# Patient Record
Sex: Female | Born: 2000 | Race: White | Hispanic: No | Marital: Single | Smoking: Never smoker
Health system: Southern US, Community
[De-identification: ages and names within clinical notes are randomized; demographics above are authoritative.]

## PROBLEM LIST (undated history)

## (undated) DIAGNOSIS — F419 Anxiety disorder, unspecified: Secondary | ICD-10-CM

## (undated) DIAGNOSIS — F329 Major depressive disorder, single episode, unspecified: Secondary | ICD-10-CM

## (undated) DIAGNOSIS — S060XAA Concussion with loss of consciousness status unknown, initial encounter: Secondary | ICD-10-CM

## (undated) DIAGNOSIS — G43909 Migraine, unspecified, not intractable, without status migrainosus: Secondary | ICD-10-CM

## (undated) DIAGNOSIS — S060X9A Concussion with loss of consciousness of unspecified duration, initial encounter: Secondary | ICD-10-CM

## (undated) DIAGNOSIS — F32A Depression, unspecified: Secondary | ICD-10-CM

## (undated) HISTORY — DX: Major depressive disorder, single episode, unspecified: F32.9

## (undated) HISTORY — DX: Concussion with loss of consciousness status unknown, initial encounter: S06.0XAA

## (undated) HISTORY — DX: Depression, unspecified: F32.A

## (undated) HISTORY — PX: WISDOM TOOTH EXTRACTION: SHX21

## (undated) HISTORY — DX: Concussion with loss of consciousness of unspecified duration, initial encounter: S06.0X9A

## (undated) HISTORY — DX: Migraine, unspecified, not intractable, without status migrainosus: G43.909

## (undated) HISTORY — DX: Anxiety disorder, unspecified: F41.9

---
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
## 2020-01-03 NOTE — Telephone Encounter (Signed)
Left Vm for pt to CB.

## 2020-01-04 ENCOUNTER — Other Ambulatory Visit: Payer: Self-pay

## 2020-01-04 DIAGNOSIS — R000 Tachycardia, unspecified: Secondary | ICD-10-CM

## 2020-01-04 DIAGNOSIS — R002 Palpitations: Secondary | ICD-10-CM

## 2020-01-04 NOTE — Telephone Encounter (Signed)
Please see message below regarding cardiology referral.

## 2020-01-04 NOTE — Telephone Encounter (Signed)
Please change cardio referral or addend referral if able to the cardiologist she mentioned.  Thanks.

## 2020-01-04 NOTE — Telephone Encounter (Signed)
MyChart message read.

## 2020-01-04 NOTE — Telephone Encounter (Signed)
No way to addend current referral. New referral placed with Dr.Miller. Patient will be notified via mychart message.

## 2020-01-06 LAB — COMPREHENSIVE METABOLIC PANEL: Calcium: 5 — AB (ref 8.7–10.7)

## 2020-01-07 ENCOUNTER — Ambulatory Visit: Payer: No Typology Code available for payment source

## 2020-01-10 ENCOUNTER — Ambulatory Visit (INDEPENDENT_AMBULATORY_CARE_PROVIDER_SITE_OTHER): Payer: No Typology Code available for payment source | Admitting: Clinical

## 2020-01-10 DIAGNOSIS — F419 Anxiety disorder, unspecified: Secondary | ICD-10-CM

## 2020-01-19 ENCOUNTER — Encounter: Payer: Self-pay | Admitting: General Practice

## 2020-01-24 ENCOUNTER — Ambulatory Visit (INDEPENDENT_AMBULATORY_CARE_PROVIDER_SITE_OTHER): Payer: No Typology Code available for payment source | Admitting: Clinical

## 2020-01-24 DIAGNOSIS — F419 Anxiety disorder, unspecified: Secondary | ICD-10-CM

## 2020-02-04 ENCOUNTER — Telehealth (INDEPENDENT_AMBULATORY_CARE_PROVIDER_SITE_OTHER): Payer: No Typology Code available for payment source | Admitting: Psychiatry

## 2020-02-04 ENCOUNTER — Other Ambulatory Visit: Payer: Self-pay

## 2020-02-04 DIAGNOSIS — F410 Panic disorder [episodic paroxysmal anxiety] without agoraphobia: Secondary | ICD-10-CM

## 2020-02-04 DIAGNOSIS — F411 Generalized anxiety disorder: Secondary | ICD-10-CM

## 2020-02-04 MED ORDER — HYDROXYZINE HCL 25 MG PO TABS: 25.0000 mg | ORAL_TABLET | Freq: Two times a day (BID) | ORAL | 1 refills | Status: DC | PRN

## 2020-02-04 MED ORDER — VORTIOXETINE HBR 20 MG PO TABS: 20.0000 mg | ORAL_TABLET | Freq: Every day | ORAL | 5 refills | Status: DC

## 2020-02-04 NOTE — Progress Notes (Signed)
BH MD/PA/NP OP Progress Note  02/04/2020 10:15 AM Marisa Gregory  MRN:  [id number] Interview was conducted by phone and I verified that I was speaking with the correct person using two identifiers. I discussed the limitations of evaluation and management by telemedicine and  the availability of in person appointments. Patient expressed understanding and agreed to proceed. Patient location - home; physician - home office.  Chief Complaint: Palpitations.  HPI: 19 yo SWF referred to [place] by her pediatrican for treatment of generalized anxiety disorder. Patient has been struggling with anxiety (and occasional depression) for a few years. She denies experiencing any major trauma in the past, no hx of abuse. 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 She reports feeling "tense all the time", is short of breath and has rapid heartbeat. She does not describe having distinct panic attacks though. No social or performance anxiety reported. She has been prescribed sertraline in 2018 and it worked well for a while. It did however cause some sleep disturbance and she eventually stopped it and was instead started on escitalopram. She tolerated it well and again it seemed to help with anxiety. Dose was gradually increased to 15mg as its effectiveness started to wane. Eventually her pediatrician recommended tapering it off and to seek psychiatric assessment/apointment. .We have started desvenlafaxine 50 mg daily, then increased it to 100 mg. She tolerates it well: no sedation, no increased sleep problems, no GI upset. She did not however experience decrease in anxiety levelafter being on it for two months. [facility] tried to start Trintellix but copayment on it would be $100 with insurance. We therefore added fluvoxamine abut after being on 100 mg for a month her anxiety did not subside and she actually reports feeling more depressed since starting it. Since then her insurtance approved use of Trintellix and after  being on 10 mg dose for few weeks she reports that her anxiety noticeably subsided.Raylynne is in individual counseling.We also planned to do Genesight test but insurance would not cover it. She started college at [employer]. She reports frequent episodes of chest tightness/palpitations. She went to ED and was told that workup is negative and that these are likely panic attacks. She was prescribed hydroxyzine 25 mg which was helpful.    Visit Diagnosis:    ICD-10-CM   1. GAD (generalized anxiety disorder)  F41.1   2. Panic disorder  F41.0     Past Psychiatric History: Please see intake H&P.  Past Medical History:  Past Medical History:  Diagnosis Date  . Anxiety   . Concussion   . Concussion without loss of consciousness 06/11/2017  . Depression   . Migraines     Past Surgical History:  Procedure Laterality Date  . WISDOM TOOTH EXTRACTION      Family Psychiatric History: Reviewed.  Family History:  Family History  Problem Relation Age of Onset  . Migraines Brother   . Alzheimer's disease Maternal Grandmother   . Hyperlipidemia Maternal Grandmother   . Arthritis Maternal Grandmother   . Parkinson's disease Maternal Grandfather   . Arthritis Maternal Grandfather   . Endometrial cancer Paternal Grandmother   . Thyroid disease Paternal Grandmother   . Arthritis Paternal Grandmother   . Depression Paternal Grandmother   . Anxiety disorder Cousin   . Alcohol abuse Father   . Arthritis Paternal Grandfather   . Early death Paternal Grandfather     Social History:  Social History   Socioeconomic History  . Marital status: Single  Spouse name: Not on file  . Number of children: Not on file  . Years of education: Not on file  . Highest education level: Not on file  Occupational History  . Occupation: HS student  Tobacco Use  . Smoking status: Never Smoker  . Smokeless tobacco: Never Used  Vaping Use  . Vaping Use: Never used  Substance and Sexual Activity  . Alcohol  use: No  . Drug use: Never  . Sexual activity: Never    Partners: Male    Birth control/protection: Abstinence    Comment: never sexually active  Other Topics Concern  . Not on file  Social History Narrative   Marital status/children/pets: single.    Education/employment: Electronics engineer.    Safety:      -smoke alarm in the home:Yes     - wears seatbelt: Yes     - Feels safe in their relationships: Yes   Social Determinants of Health   Financial Resource Strain:   . Difficulty of Paying Living Expenses: Not on file  Food Insecurity:   . Worried About Charity fundraiser in the Last Year: Not on file  . Ran Out of Food in the Last Year: Not on file  Transportation Needs:   . Lack of Transportation (Medical): Not on file  . Lack of Transportation (Non-Medical): Not on file  Physical Activity:   . Days of Exercise per Week: Not on file  . Minutes of Exercise per Session: Not on file  Stress:   . Feeling of Stress : Not on file  Social Connections:   . Frequency of Communication with Friends and Family: Not on file  . Frequency of Social Gatherings with Friends and Family: Not on file  . Attends Religious Services: Not on file  . Active Member of Clubs or Organizations: Not on file  . Attends Archivist Meetings: Not on file  . Marital Status: Not on file    Allergies: No Known Allergies  Metabolic Disorder Labs: No results found for: HGBA1C, MPG Lab Results  Component Value Date   PROLACTIN 20.3 06/24/2018   No results found for: CHOL, TRIG, HDL, CHOLHDL, VLDL, LDLCALC Lab Results  Component Value Date   TSH 0.88 12/06/2019   TSH 0.502 06/24/2018    Therapeutic Level Labs: No results found for: LITHIUM No results found for: VALPROATE No components found for:  CBMZ  Current Medications: Current Outpatient Medications  Medication Sig Dispense Refill  . hydrOXYzine (ATARAX/VISTARIL) 25 MG tablet Take 1 tablet (25 mg total) by mouth 2 (two) times  daily as needed for anxiety. 60 tablet 1  . Norethindrone Acetate-Ethinyl Estrad-FE (BLISOVI 24 FE) 1-20 MG-MCG(24) tablet Take 1 tablet by mouth daily. 28 tablet 11  . PREVIDENT 5000 PLUS 1.1 % CREA dental cream SMARTSIG:Application By Mouth    . vortioxetine HBr (TRINTELLIX) 20 MG TABS tablet Take 1 tablet (20 mg total) by mouth daily. 30 tablet 5   No current facility-administered medications for this visit.     Psychiatric Specialty Exam: Review of Systems  Cardiovascular: Positive for palpitations.  Psychiatric/Behavioral: The patient is nervous/anxious.   All other systems reviewed and are negative.   There were no vitals taken for this visit.There is no height or weight on file to calculate BMI.  General Appearance: NA  Eye Contact:  NA  Speech:  Clear and Coherent and Normal Rate  Volume:  Normal  Mood:  Anxious  Affect:  NA  Thought Process:  Goal  Directed and Linear  Orientation:  Full (Time, Place, and Person)  Thought Content: Logical   Suicidal Thoughts:  No  Homicidal Thoughts:  No  Memory:  Immediate;   Good Recent;   Good Remote;   Good  Judgement:  Good  Insight:  Fair  Psychomotor Activity:  NA  Concentration:  Concentration: Good  Recall:  Good  Fund of Knowledge: Good  Language: Good  Akathisia:  Negative  Handed:  Right  AIMS (if indicated): not done  Assets:  Communication Skills Desire for Improvement Financial Resources/Insurance Housing Social Support Vocational/Educational  ADL's:  Intact  Cognition: WNL  Sleep:  Good    Assessment and Plan: 19 yo SWF referred to [place] by her pediatrican for treatment of generalized anxiety disorder. Patient has been struggling with anxiety (and occasional depression) for a few years. She denies experiencing any major trauma in the past, no hx of abuse. 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 She reports feeling "tense all the time", is short of breath and has rapid heartbeat. She does not describe  having distinct panic attacks though. No social or performance anxiety reported. She has been prescribed sertraline in 2018 and it worked well for a while. It did however cause some sleep disturbance and she eventually stopped it and was instead started on escitalopram. She tolerated it well and again it seemed to help with anxiety. Dose was gradually increased to 15mg as its effectiveness started to wane. Eventually her pediatrician recommended tapering it off and to seek psychiatric assessment/apointment. .We have started desvenlafaxine 50 mg daily, then increased it to 100 mg. She tolerates it well: no sedation, no increased sleep problems, no GI upset. She did not however experience decrease in anxiety levelafter being on it for two months. Wethen tried to start Trintellix but copayment on it would be $100 with insurance. We therefore added fluvoxamine abut after being on 100 mg for a month her anxiety did not subside and she actually reports feeling more depressed since starting it. Since then her insurtance approved use of Trintellix and after being on 10 mg dose for few weeks she reports that her anxiety noticeably subsided.Jennessa is in individual counseling.We also planned to do Genesight test but insurance would not cover it. She started college at [employer]. She reports frequent episodes of chest tightness/palpitations. She went to ED and was told that workup is negative and that these are likely panic attacks. She was prescribed hydroxyzine 25 mg which was helpful.   Dx: GAD; Panic disorder  Plan: We willcontinue Trintellix but increase dose to 20 mg daily (takes it in PM) and continue hydroxyzine 25 mg bid prn anxiety.  Next appointment in2 months.The plan was discussed with patient who had an opportunity to ask questions and these were all answered. I spend6minutes inphone consultationwith the patient.   Stephanie Acre, MD 02/04/2020, 10:14 AM

## 2020-02-10 ENCOUNTER — Ambulatory Visit: Payer: No Typology Code available for payment source | Admitting: Clinical

## 2020-02-15 ENCOUNTER — Ambulatory Visit (INDEPENDENT_AMBULATORY_CARE_PROVIDER_SITE_OTHER): Payer: No Typology Code available for payment source | Admitting: Clinical

## 2020-02-15 DIAGNOSIS — F419 Anxiety disorder, unspecified: Secondary | ICD-10-CM | POA: Diagnosis not present

## 2020-02-29 ENCOUNTER — Ambulatory Visit: Payer: No Typology Code available for payment source | Admitting: Clinical

## 2020-03-07 ENCOUNTER — Telehealth (HOSPITAL_COMMUNITY): Payer: No Typology Code available for payment source | Admitting: Psychiatry

## 2020-04-03 ENCOUNTER — Other Ambulatory Visit: Payer: Self-pay

## 2020-04-03 ENCOUNTER — Telehealth (INDEPENDENT_AMBULATORY_CARE_PROVIDER_SITE_OTHER): Payer: No Typology Code available for payment source | Admitting: Psychiatry

## 2020-04-03 DIAGNOSIS — F411 Generalized anxiety disorder: Secondary | ICD-10-CM | POA: Diagnosis not present

## 2020-04-03 DIAGNOSIS — F410 Panic disorder [episodic paroxysmal anxiety] without agoraphobia: Secondary | ICD-10-CM

## 2020-04-03 NOTE — Progress Notes (Addendum)
BH MD/PA/NP OP Progress Note  04/03/2020 10:06 AM Marisa Gregory  MRN:  [id number] Interview was conducted by phone and I verified that I was speaking with the correct person using two identifiers. I discussed the limitations of evaluation and management by telemedicine and  the availability of in person appointments. Patient expressed understanding and agreed to proceed.  Participants in the visit: patient (location - home); physician (location - home office).  Chief Complaint: Episodic anxiety.  HPI: 19 yo SWF who has been struggling with anxiety (and occasional depression) for a few years. She denies experiencing any major trauma in the past, no hx of abuse. 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 She reports feeling "tense all the time", is short of breath and has rapid heartbeat. She does not describe having distinct panic attacks though. No social or performance anxiety reported. She has been prescribed sertraline in 2018 and it worked well for a while. It did however cause some sleep disturbance and she eventually stopped it and was instead started on escitalopram. She tolerated it well and again it seemed to help with anxiety. Dose was gradually increased to 15mg as its effectiveness started to wane. Eventually her pediatrician recommended tapering it off and to seek psychiatric assessment/apointment. .We have started desvenlafaxine 50 mg daily, then increased it to 100 mg. She tolerates it well: no sedation, no increased sleep problems, no GI upset. She did not however experience decrease in anxiety levelafter being on it for two months. Wethen tried to start Trintellix but copayment on it would be $100 with insurance. We therefore added fluvoxamine abut after being on 100 mg for a month her anxiety did not subside and she actually reports feeling more depressed since starting it. Since then her insurtance approved use of Trintellixand after being on 10 mg dose for few weeks she  reports that her anxiety noticeably subsided.Marisa Gregory is in individual counseling.We also planned to do Genesight test but insurance would not cover it.She started college at [employer]. She has been initially having frequent episodes of chest tightness/palpitations. She went to ED and was told that workup is negative and that these are likely panic attacks. She was prescribed hydroxyzine 25 mg which was helpful. We also increased dose of Trintellix to 20 mg in PM which she tolerates well and anxiety attacks are now very infrequent.   Visit Diagnosis:    ICD-10-CM   1. GAD (generalized anxiety disorder)  F41.1   2. Panic disorder  F41.0     Past Psychiatric History: Please see intake H&P.  Past Medical History:  Past Medical History:  Diagnosis Date  . Anxiety   . Concussion   . Concussion without loss of consciousness 06/11/2017  . Depression   . Fracture of lumbar spine ([place]) 08/2015   stress fracture  . Migraines     Past Surgical History:  Procedure Laterality Date  . WISDOM TOOTH EXTRACTION      Family Psychiatric History: Reviewed.  Family History:  Family History  Problem Relation Age of Onset  . Migraines Brother   . Alzheimer's disease Maternal Grandmother   . Hyperlipidemia Maternal Grandmother   . Arthritis Maternal Grandmother   . Parkinson's disease Maternal Grandmother   . Parkinson's disease Maternal Grandfather   . Arthritis Maternal Grandfather   . Endometrial cancer Paternal Grandmother   . Thyroid disease Paternal Grandmother   . Arthritis Paternal Grandmother   . Depression Paternal Grandmother   . Anxiety disorder Cousin   . Alcohol  abuse Father   . Allergies Father   . Arthritis Paternal Grandfather   . Early death Paternal Grandfather     Social History:  Social History   Socioeconomic History  . Marital status: Single    Spouse name: Not on file  . Number of children: Not on file  . Years of education: Not on file  . Highest education  level: Not on file  Occupational History  . Occupation: HS student  Tobacco Use  . Smoking status: Never Smoker  . Smokeless tobacco: Never Used  . Tobacco comment: no smoke exposure  Vaping Use  . Vaping Use: Never used  Substance and Sexual Activity  . Alcohol use: No  . Drug use: Never  . Sexual activity: Never    Partners: Male    Birth control/protection: Abstinence    Comment: never sexually active  Other Topics Concern  . Not on file  Social History Narrative   Marital status/children/pets: single.    Education/employment: Electronics engineer.    Safety:      -smoke alarm in the home:Yes     - wears seatbelt: Yes     - Feels safe in their relationships: Yes   Social Determinants of Health   Financial Resource Strain:   . Difficulty of Paying Living Expenses: Not on file  Food Insecurity:   . Worried About Charity fundraiser in the Last Year: Not on file  . Ran Out of Food in the Last Year: Not on file  Transportation Needs:   . Lack of Transportation (Medical): Not on file  . Lack of Transportation (Non-Medical): Not on file  Physical Activity:   . Days of Exercise per Week: Not on file  . Minutes of Exercise per Session: Not on file  Stress:   . Feeling of Stress : Not on file  Social Connections:   . Frequency of Communication with Friends and Family: Not on file  . Frequency of Social Gatherings with Friends and Family: Not on file  . Attends Religious Services: Not on file  . Active Member of Clubs or Organizations: Not on file  . Attends Archivist Meetings: Not on file  . Marital Status: Not on file    Allergies: No Known Allergies  Metabolic Disorder Labs: No results found for: HGBA1C, MPG Lab Results  Component Value Date   PROLACTIN 20.3 06/24/2018   No results found for: CHOL, TRIG, HDL, CHOLHDL, VLDL, LDLCALC Lab Results  Component Value Date   TSH 0.88 12/06/2019   TSH 0.502 06/24/2018    Therapeutic Level Labs: No results  found for: LITHIUM No results found for: VALPROATE No components found for:  CBMZ  Current Medications: Current Outpatient Medications  Medication Sig Dispense Refill  . hydrOXYzine (ATARAX/VISTARIL) 25 MG tablet Take 1 tablet (25 mg total) by mouth 2 (two) times daily as needed for anxiety. 60 tablet 1  . Norethindrone Acetate-Ethinyl Estrad-FE (BLISOVI 24 FE) 1-20 MG-MCG(24) tablet Take 1 tablet by mouth daily. 28 tablet 11  . PREVIDENT 5000 PLUS 1.1 % CREA dental cream SMARTSIG:Application By Mouth    . vortioxetine HBr (TRINTELLIX) 20 MG TABS tablet Take 1 tablet (20 mg total) by mouth daily. 30 tablet 5   No current facility-administered medications for this visit.     Psychiatric Specialty Exam: Review of Systems  Psychiatric/Behavioral: The patient is nervous/anxious.   All other systems reviewed and are negative.   There were no vitals taken for this visit.There is no  height or weight on file to calculate BMI.  General Appearance: NA  Eye Contact:  NA  Speech:  Clear and Coherent and Normal Rate  Volume:  Normal  Mood:  Less anxious.  Affect:  NA  Thought Process:  Goal Directed  Orientation:  Full (Time, Place, and Person)  Thought Content: Logical   Suicidal Thoughts:  No  Homicidal Thoughts:  No  Memory:  Immediate;   Good Recent;   Good Remote;   Good  Judgement:  Good  Insight:  Good  Psychomotor Activity:  NA  Concentration:  Concentration: Good  Recall:  Good  Fund of Knowledge: Good  Language: Good  Akathisia:  Negative  Handed:  Right  AIMS (if indicated): not done  Assets:  Communication Skills Desire for Improvement Financial Resources/Insurance Housing Physical Health Social Support Vocational/Educational  ADL's:  Intact  Cognition: WNL  Sleep:  Good    Assessment and Plan: 19 yo SWF who has been struggling with anxiety (and occasional depression) for a few years. She denies experiencing any major trauma in the past, no hx of abuse. 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 She reports feeling "tense all the time", is short of breath and has rapid heartbeat. She does not describe having distinct panic attacks though. No social or performance anxiety reported. She has been prescribed sertraline in 2018 and it worked well for a while. It did however cause some sleep disturbance and she eventually stopped it and was instead started on escitalopram. She tolerated it well and again it seemed to help with anxiety. Dose was gradually increased to 15mg as its effectiveness started to wane. Eventually her pediatrician recommended tapering it off and to seek psychiatric assessment/apointment. .We have started desvenlafaxine 50 mg daily, then increased it to 100 mg. She tolerates it well: no sedation, no increased sleep problems, no GI upset. She did not however experience decrease in anxiety levelafter being on it for two months. Wethen tried to start Trintellix but copayment on it would be $100 with insurance. We therefore added fluvoxamine abut after being on 100 mg for a month her anxiety did not subside and she actually reports feeling more depressed since starting it. Since then her insurtance approved use of Trintellixand after being on 10 mg dose for few weeks she reports that her anxiety noticeably subsided.Marisa Gregory is in individual counseling.We also planned to do Genesight test but insurance would not cover it.She started college at [employer]. She has been initially having frequent episodes of chest tightness/palpitations. She went to ED and was told that workup is negative and that these are likely panic attacks. She was prescribed hydroxyzine 25 mg which was helpful. We also increased dose of Trintellix to 20 mg in PM which she tolerates well and anxiety attacks are now very infrequent.  Dx: GAD; Panic disorder  Plan: We willcontinueTrintellix 20 mg daily(takes it in PM) and hydroxyzine 25 mg bid prn anxiety (does not use it  often). Next appointment in3 months.The plan was discussed with patient who had an opportunity to ask questions and these were all answered. I spend40minutes inphone consultationwith the patient.   Stephanie Acre, MD 04/03/2020, 10:06 AM

## 2020-04-07 ENCOUNTER — Other Ambulatory Visit (HOSPITAL_COMMUNITY): Payer: Self-pay | Admitting: Psychiatry

## 2020-04-14 ENCOUNTER — Ambulatory Visit (INDEPENDENT_AMBULATORY_CARE_PROVIDER_SITE_OTHER): Payer: No Typology Code available for payment source | Admitting: Clinical

## 2020-04-14 DIAGNOSIS — F419 Anxiety disorder, unspecified: Secondary | ICD-10-CM

## 2020-05-25 ENCOUNTER — Ambulatory Visit (INDEPENDENT_AMBULATORY_CARE_PROVIDER_SITE_OTHER): Payer: No Typology Code available for payment source | Admitting: Clinical

## 2020-05-25 DIAGNOSIS — F419 Anxiety disorder, unspecified: Secondary | ICD-10-CM | POA: Diagnosis not present

## 2020-05-30 ENCOUNTER — Ambulatory Visit: Payer: No Typology Code available for payment source | Admitting: Clinical

## 2020-07-04 ENCOUNTER — Telehealth (INDEPENDENT_AMBULATORY_CARE_PROVIDER_SITE_OTHER): Payer: No Typology Code available for payment source | Admitting: Psychiatry

## 2020-07-04 ENCOUNTER — Other Ambulatory Visit: Payer: Self-pay

## 2020-07-04 DIAGNOSIS — F411 Generalized anxiety disorder: Secondary | ICD-10-CM

## 2020-07-04 DIAGNOSIS — F410 Panic disorder [episodic paroxysmal anxiety] without agoraphobia: Secondary | ICD-10-CM | POA: Diagnosis not present

## 2020-07-04 MED ORDER — VORTIOXETINE HBR 20 MG PO TABS: 20.0000 mg | ORAL_TABLET | Freq: Every day | ORAL | 5 refills | Status: DC

## 2020-07-04 NOTE — Progress Notes (Signed)
BH MD/PA/NP OP Progress Note  07/04/2020 1:37 PM Marisa Gregory  MRN:  [id number] Interview was conducted by phone and I verified that I was speaking with the correct person using two identifiers. I discussed the limitations of evaluation and management by telemedicine and  the availability of in person appointments. Patient expressed understanding and agreed to proceed. Participants in the visit: patient (location - home); physician (location - home office).  Chief Complaint: None.  HPI: 20 yo SWF who has been struggling with anxiety (and occasional depression) for a few years. She denies experiencing any major trauma in the past, no hx of abuse. 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 She reports occasional feeling of being tense, short of breath and having rapid heartbeat. No social or performance anxiety reported. She has been prescribed sertraline in 2018 and it worked well for a while. It did however cause some sleep disturbance and she eventually stopped it and was instead started on escitalopram. She tolerated it well and again it seemed to help with anxiety. Dose was gradually increased to 15mg as its effectiveness started to wane. We then tried desvenlafaxine and later fluvoxamine without much benefit in terms of decrease in anxiety. She then started on vortioxetine which is well tolerated and works well.She started college at [employer]. She was also prescribed hydroxyzine 25 mg which was helpful and she does not need it often as anxiety attacks are now very infrequent. She is also in counseling.   Visit Diagnosis:    ICD-10-CM   1. Panic disorder  F41.0   2. GAD (generalized anxiety disorder)  F41.1     Past Psychiatric History: Please see intake H&P.  Past Medical History:  Past Medical History:  Diagnosis Date  . Anxiety   . Concussion   . Concussion without loss of consciousness 06/11/2017  . Depression   . Fracture of lumbar spine ([place]) 08/2015   stress fracture  .  Migraines     Past Surgical History:  Procedure Laterality Date  . WISDOM TOOTH EXTRACTION      Family Psychiatric History: Reviewed.  Family History:  Family History  Problem Relation Age of Onset  . Migraines Brother   . Alzheimer's disease Maternal Grandmother   . Hyperlipidemia Maternal Grandmother   . Arthritis Maternal Grandmother   . Parkinson's disease Maternal Grandmother   . Parkinson's disease Maternal Grandfather   . Arthritis Maternal Grandfather   . Endometrial cancer Paternal Grandmother   . Thyroid disease Paternal Grandmother   . Arthritis Paternal Grandmother   . Depression Paternal Grandmother   . Anxiety disorder Cousin   . Alcohol abuse Father   . Allergies Father   . Arthritis Paternal Grandfather   . Early death Paternal Grandfather     Social History:  Social History   Socioeconomic History  . Marital status: Single    Spouse name: Not on file  . Number of children: Not on file  . Years of education: Not on file  . Highest education level: Not on file  Occupational History  . Occupation: HS student  Tobacco Use  . Smoking status: Never Smoker  . Smokeless tobacco: Never Used  . Tobacco comment: no smoke exposure  Vaping Use  . Vaping Use: Never used  Substance and Sexual Activity  . Alcohol use: No  . Drug use: Never  . Sexual activity: Never    Partners: Male    Birth control/protection: Abstinence    Comment: never sexually active  Other  Topics Concern  . Not on file  Social History Narrative   Marital status/children/pets: single.    Education/employment: Electronics engineer.    Safety:      -smoke alarm in the home:Yes     - wears seatbelt: Yes     - Feels safe in their relationships: Yes   Social Determinants of Health   Financial Resource Strain: Not on file  Food Insecurity: Not on file  Transportation Needs: Not on file  Physical Activity: Not on file  Stress: Not on file  Social Connections: Not on file     Allergies: No Known Allergies  Metabolic Disorder Labs: No results found for: HGBA1C, MPG Lab Results  Component Value Date   PROLACTIN 20.3 06/24/2018   No results found for: CHOL, TRIG, HDL, CHOLHDL, VLDL, LDLCALC Lab Results  Component Value Date   TSH 0.88 12/06/2019   TSH 0.502 06/24/2018    Therapeutic Level Labs: No results found for: LITHIUM No results found for: VALPROATE No components found for:  CBMZ  Current Medications: Current Outpatient Medications  Medication Sig Dispense Refill  . hydrOXYzine (ATARAX/VISTARIL) 25 MG tablet TAKE 1 TABLET BY MOUTH TWICE A DAY AS NEEDED FOR ANXIETY 60 tablet 1  . Norethindrone Acetate-Ethinyl Estrad-FE (BLISOVI 24 FE) 1-20 MG-MCG(24) tablet Take 1 tablet by mouth daily. 28 tablet 11  . PREVIDENT 5000 PLUS 1.1 % CREA dental cream SMARTSIG:Application By Mouth    . vortioxetine HBr (TRINTELLIX) 20 MG TABS tablet Take 1 tablet (20 mg total) by mouth daily. 30 tablet 5   No current facility-administered medications for this visit.     Psychiatric Specialty Exam: Review of Systems  All other systems reviewed and are negative.   There were no vitals taken for this visit.There is no height or weight on file to calculate BMI.  General Appearance: NA  Eye Contact:  NA  Speech:  Clear and Coherent and Normal Rate  Volume:  Normal  Mood:  Euthymic  Affect:  NA  Thought Process:  Goal Directed  Orientation:  Full (Time, Place, and Person)  Thought Content: Logical   Suicidal Thoughts:  No  Homicidal Thoughts:  No  Memory:  Immediate;   Good Recent;   Good Remote;   Good  Judgement:  Good  Insight:  Good  Psychomotor Activity:  NA  Concentration:  Concentration: Good  Recall:  Good  Fund of Knowledge: Good  Language: Good  Akathisia:  Negative  Handed:  Right  AIMS (if indicated): not done  Assets:  Communication Skills Desire for Improvement Financial Resources/Insurance Housing Physical Health Social  Support Vocational/Educational  ADL's:  Intact  Cognition: WNL  Sleep:  Good    Assessment and Plan: 20 yo SWF who has been struggling with anxiety (and occasional depression) for a few years. She denies experiencing any major trauma in the past, no hx of abuse. 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 She reports occasional feeling of being tense, short of breath and having rapid heartbeat. No social or performance anxiety reported. She has been prescribed sertraline in 2018 and it worked well for a while. It did however cause some sleep disturbance and she eventually stopped it and was instead started on escitalopram. She tolerated it well and again it seemed to help with anxiety. Dose was gradually increased to 15mg as its effectiveness started to wane. We then tried desvenlafaxine and later fluvoxamine without much benefit in terms of decrease in anxiety. She then started on vortioxetine  which is well tolerated and works well.She started college at [employer]. She was also prescribed hydroxyzine 25 mg which was helpful and she does not need it often as anxiety attacks are now very infrequent. She is also in counseling.  Dx: GAD; Panic disorder  Plan: We willcontinueTrintellix20mg  daily(takes it in PM)and hydroxyzine 25 mg prn anxiety (does not use it often). Next appointment in62months with a new provider.The plan was discussed with patient who had an opportunity to ask questions and these were all answered. I spend19minutes inphone consultationwith the patient.    Stephanie Acre, MD 07/04/2020, 1:37 PM

## 2020-07-06 ENCOUNTER — Ambulatory Visit (INDEPENDENT_AMBULATORY_CARE_PROVIDER_SITE_OTHER): Payer: No Typology Code available for payment source | Admitting: Clinical

## 2020-07-06 DIAGNOSIS — F419 Anxiety disorder, unspecified: Secondary | ICD-10-CM

## 2020-07-11 ENCOUNTER — Other Ambulatory Visit: Payer: Self-pay

## 2020-07-11 DIAGNOSIS — Z8742 Personal history of other diseases of the female genital tract: Secondary | ICD-10-CM

## 2020-07-11 DIAGNOSIS — N920 Excessive and frequent menstruation with regular cycle: Secondary | ICD-10-CM

## 2020-07-11 MED ORDER — BLISOVI 24 FE 1-20 MG-MCG(24) PO TABS: 1.0000 | ORAL_TABLET | Freq: Every day | ORAL | 0 refills | Status: AC

## 2020-07-11 NOTE — Telephone Encounter (Signed)
Medication refill request: [place] 24 fe  Last AEX:  07/14/19 Next AEX: nothing scheduled. Note sent to pharmacy to advise patient aex is needed for additional refills.  Last MMG (if hormonal medication request): NA Refill authorized: 1 pack with 0 rf until AEX is scheduled.

## 2020-08-16 ENCOUNTER — Ambulatory Visit (INDEPENDENT_AMBULATORY_CARE_PROVIDER_SITE_OTHER): Payer: No Typology Code available for payment source | Admitting: Clinical

## 2020-08-16 DIAGNOSIS — F419 Anxiety disorder, unspecified: Secondary | ICD-10-CM

## 2020-08-17 ENCOUNTER — Ambulatory Visit (INDEPENDENT_AMBULATORY_CARE_PROVIDER_SITE_OTHER): Payer: No Typology Code available for payment source | Admitting: Clinical

## 2020-08-17 DIAGNOSIS — F419 Anxiety disorder, unspecified: Secondary | ICD-10-CM

## 2020-08-28 ENCOUNTER — Ambulatory Visit (INDEPENDENT_AMBULATORY_CARE_PROVIDER_SITE_OTHER): Payer: No Typology Code available for payment source | Admitting: Clinical

## 2020-08-28 DIAGNOSIS — F419 Anxiety disorder, unspecified: Secondary | ICD-10-CM | POA: Diagnosis not present

## 2020-09-01 ENCOUNTER — Ambulatory Visit (INDEPENDENT_AMBULATORY_CARE_PROVIDER_SITE_OTHER): Payer: No Typology Code available for payment source | Admitting: Clinical

## 2020-09-01 DIAGNOSIS — F419 Anxiety disorder, unspecified: Secondary | ICD-10-CM | POA: Diagnosis not present

## 2020-09-07 ENCOUNTER — Ambulatory Visit (INDEPENDENT_AMBULATORY_CARE_PROVIDER_SITE_OTHER): Payer: No Typology Code available for payment source | Admitting: Clinical

## 2020-09-07 ENCOUNTER — Encounter: Payer: Self-pay | Admitting: Family Medicine

## 2020-09-07 DIAGNOSIS — F419 Anxiety disorder, unspecified: Secondary | ICD-10-CM | POA: Diagnosis not present

## 2020-09-13 NOTE — Telephone Encounter (Signed)
FYI pt sched Monday 5/9

## 2020-09-13 NOTE — Telephone Encounter (Signed)
Can we see if we can get records form her reported admission?  Thanks.

## 2020-09-14 ENCOUNTER — Ambulatory Visit (INDEPENDENT_AMBULATORY_CARE_PROVIDER_SITE_OTHER): Payer: No Typology Code available for payment source | Admitting: Clinical

## 2020-09-14 DIAGNOSIS — F419 Anxiety disorder, unspecified: Secondary | ICD-10-CM | POA: Diagnosis not present

## 2020-09-18 ENCOUNTER — Ambulatory Visit: Payer: No Typology Code available for payment source | Admitting: Family Medicine

## 2020-09-18 ENCOUNTER — Encounter: Payer: Self-pay | Admitting: Family Medicine

## 2020-09-18 ENCOUNTER — Other Ambulatory Visit: Payer: Self-pay

## 2020-09-18 VITALS — BP 106/73 | HR 77 | Temp 98.4°F | Ht 68.0 in | Wt 178.8 lb

## 2020-09-18 DIAGNOSIS — Z79899 Other long term (current) drug therapy: Secondary | ICD-10-CM

## 2020-09-18 DIAGNOSIS — F411 Generalized anxiety disorder: Secondary | ICD-10-CM

## 2020-09-18 DIAGNOSIS — F32A Depression, unspecified: Secondary | ICD-10-CM

## 2020-09-18 DIAGNOSIS — F329 Major depressive disorder, single episode, unspecified: Secondary | ICD-10-CM

## 2020-09-18 MED ORDER — LITHIUM CARBONATE ER 300 MG PO TBCR: 300.0000 mg | EXTENDED_RELEASE_TABLET | Freq: Two times a day (BID) | ORAL | 0 refills | Status: AC

## 2020-09-18 MED ORDER — ARIPIPRAZOLE 2 MG PO TABS: 2.0000 mg | ORAL_TABLET | Freq: Every day | ORAL | 0 refills | Status: AC

## 2020-09-18 NOTE — Patient Instructions (Signed)
Great to see you today.  I have refilled the medication(s) we provide.   If labs were collected, we will inform you of lab results once received either by echart message or telephone call.   - echart message- for normal results that have been seen by the patient already.   - telephone call: abnormal results or if patient has not viewed results in their echart.  

## 2020-09-18 NOTE — Progress Notes (Signed)
This visit occurred during the SARS-CoV-2 public health emergency.  Safety protocols were in place, including screening questions prior to the visit, additional usage of staff PPE, and extensive cleaning of exam room while observing appropriate contact time as indicated for disinfecting solutions.    Marisa Gregory, Marisa Gregory 2000/07/30, 20 y.o., female MRN: [id number] Patient Care Team    Relationship Specialty Notifications Start End  Ma Hillock, DO PCP - General Family Medicine  12/06/19   Regina Eck, CNM  Certified Nurse Midwife  12/06/19   Renelda Loma, OD  Optometry  12/06/19   Myrtie Cruise  Psychology  12/06/19   Stephanie Acre, MD (Inactive) Consulting Physician Psychiatry  12/06/19     Chief Complaint  Patient presents with  . Depression     Subjective: Pt presents for an OV with concerns of start of lithium without lab recheck. She reports she was admitted to [facility] in [place]18[place]2020-08/25/2020 for depression with suicidal thoughts. Her trintellix and vistaril were dcd (prescribed by psych). She was started on lithium and abilify. She has new psychiatrist appt scheduled 09/29/2020 in [place]. She will run out of her medication a few days prior to her appt.   No flowsheet data found.  No Known Allergies Social History   Social History Narrative   Marital status/children/pets: single.    Education/employment: Electronics engineer.    Safety:      -smoke alarm in the home:Yes     - wears seatbelt: Yes     - Feels safe in their relationships: Yes   Past Medical History:  Diagnosis Date  . Anxiety   . Concussion   . Concussion without loss of consciousness 06/11/2017  . Depression   . Fracture of lumbar spine ([place]) 08/2015   stress fracture  . Migraines    Past Surgical History:  Procedure Laterality Date  . WISDOM TOOTH EXTRACTION     Family History  Problem Relation Age of Onset  . Migraines Brother   . Alzheimer's disease Maternal  Grandmother   . Hyperlipidemia Maternal Grandmother   . Arthritis Maternal Grandmother   . Parkinson's disease Maternal Grandmother   . Parkinson's disease Maternal Grandfather   . Arthritis Maternal Grandfather   . Endometrial cancer Paternal Grandmother   . Thyroid disease Paternal Grandmother   . Arthritis Paternal Grandmother   . Depression Paternal Grandmother   . Anxiety disorder Cousin   . Alcohol abuse Father   . Allergies Father   . Arthritis Paternal Grandfather   . Early death Paternal Grandfather    Allergies as of 09/18/2020   No Known Allergies     Medication List       Accurate as of Sep 18, 2020  2:27 PM. If you have any questions, ask your nurse or doctor.        STOP taking these medications   hydrOXYzine 25 MG tablet Commonly known as: ATARAX/VISTARIL Stopped by: Howard Pouch, DO   PreviDent 5000 Plus 1.1 % Crea dental cream Generic drug: sodium fluoride Stopped by: Howard Pouch, DO   vortioxetine HBr 20 MG Tabs tablet Commonly known as: TRINTELLIX Stopped by: Howard Pouch, DO     TAKE these medications   ARIPiprazole 2 MG tablet Commonly known as: ABILIFY Take 1 tablet (2 mg total) by mouth daily.   Blisovi 24 Fe 1-20 MG-MCG(24) tablet Generic drug: Norethindrone Acetate-Ethinyl Estrad-FE Take 1 tablet by mouth daily.   lithium carbonate 300 MG CR  tablet Commonly known as: LITHOBID Take 1 tablet (300 mg total) by mouth 2 (two) times daily.       All past medical history, surgical history, allergies, family history, immunizations andmedications were updated in the EMR today and reviewed under the history and medication portions of their EMR.     ROS: Negative, with the exception of above mentioned in HPI   Objective:  BP 106/73 (BP Location: Right Arm, Patient Position: Sitting, Cuff Size: Large)   Pulse 77   Temp 98.4 F (36.9 C) (Oral)   Ht '5\' 8"'  (1.727 m)   Wt 178 lb 12.8 oz (81.1 kg)   SpO2 98%   BMI 27.19 kg/m  Body mass  index is 27.19 kg/m. Gen: Afebrile. No acute distress. Nontoxic in appearance, well developed, well nourished.  HENT: AT. [place]. Eyes:Pupils Equal Round Reactive to light, Extraocular movements intact,  Conjunctiva without redness, discharge or icterus. Neck/lymp/endocrine: Supple,no lymphadenopathy, no thyromegaly CV: RRR, no edema Chest: CTAB, no wheeze or crackles. Good air movement, normal resp effort.  Neuro:  Normal gait. PERLA. EOMi. Alert. Oriented x3  Psych: Normal affect, dress and demeanor. Normal speech. Normal thought content and judgment. No SI or HI  No exam data present No results found. No results found for this or any previous visit (from the past 24 hour(s)).  Assessment/Plan: Marisa Gregory is a 20 y.o. female present for OV for   high-risk medication/Depressive disorder/GAD She is stable today. No SI or HI.  Filled 5 days of lithium and abilify for her to bridge until new psychiatrist establishment appt. In [place].  - Comp Met (CMET) - Lithium level - TSH F/u prn    Reviewed expectations re: course of current medical issues.  Discussed self-management of symptoms.  Outlined signs and symptoms indicating need for more acute intervention.  Patient verbalized understanding and all questions were answered.  Patient received an After-Visit Summary.    Orders Placed This Encounter  Procedures  . Comp Met (CMET)  . Lithium level  . TSH   Meds ordered this encounter  Medications  . lithium carbonate (LITHOBID) 300 MG CR tablet    Sig: Take 1 tablet (300 mg total) by mouth 2 (two) times daily.    Dispense:  10 tablet    Refill:  0  . ARIPiprazole (ABILIFY) 2 MG tablet    Sig: Take 1 tablet (2 mg total) by mouth daily.    Dispense:  5 tablet    Refill:  0   Referral Orders  No referral(s) requested today     Note is dictated utilizing voice recognition software. Although note has been proof read prior to signing, occasional typographical  errors still can be missed. If any questions arise, please do not hesitate to call for verification.   electronically signed by:  Howard Pouch, DO  [place]

## 2020-09-19 ENCOUNTER — Telehealth: Payer: Self-pay | Admitting: Family Medicine

## 2020-09-19 LAB — COMPREHENSIVE METABOLIC PANEL
AG Ratio: 1.8 (calc) (ref 1.0–2.5)
ALT: 48 U/L — ABNORMAL HIGH (ref 5–32)
AST: 60 U/L — ABNORMAL HIGH (ref 12–32)
Albumin: 4.6 g/dL (ref 3.6–5.1)
Alkaline phosphatase (APISO): 56 U/L (ref 36–128)
BUN: 7 mg/dL (ref 7–20)
CO2: 24 mmol/L (ref 20–32)
Calcium: 9.6 mg/dL (ref 8.9–10.4)
Chloride: 102 mmol/L (ref 98–110)
Creat: 0.73 mg/dL (ref 0.50–1.00)
Globulin: 2.5 g/dL (calc) (ref 2.0–3.8)
Glucose, Bld: 65 mg/dL (ref 65–99)
Potassium: 4.1 mmol/L (ref 3.8–5.1)
Sodium: 138 mmol/L (ref 135–146)
Total Bilirubin: 0.5 mg/dL (ref 0.2–1.1)
Total Protein: 7.1 g/dL (ref 6.3–8.2)

## 2020-09-19 LAB — LITHIUM LEVEL: Lithium Lvl: 0.5 mmol/L — ABNORMAL LOW (ref 0.6–1.2)

## 2020-09-19 LAB — TSH: TSH: 1.05 mIU/L

## 2020-09-19 NOTE — Telephone Encounter (Signed)
Spoke with pt regarding labs and instructions.   

## 2020-09-19 NOTE — Telephone Encounter (Signed)
Please inform patient the following information: Thyroid is functioning normal. Lithium levels are not too high. Kidney functions are normal. Her liver enzymes are mildly elevated (AST/ALT).  If she is consuming any alcohol or Tylenol products regularly, I would encourage her to stop use.  -There is a possibility this is also caused by her Abilify.  Difficult to be certain when I do not have any comparison labs just prior to restarting the Abilify.  If she can get copies of the labs taken during her admission there may be liver enzymes collected during that time that we could compare to. I was able to compare labs from 2019 in [facility] in which she had just a very mildly AST elevation (1 liver enzyme) at that time.  For now, I would encourage her to discuss the elevation in liver enzymes with her new psychiatry team.  Please make sure she has access to her results or we can print out and mail to her so that she can provide to her psychiatry team.  She should have follow-up in 4 weeks for repeat liver enzymes after following the above instructions and if still elevated at that time, would need to pursue work-up to rule out causes of elevation of liver enzymes.

## 2020-09-20 ENCOUNTER — Other Ambulatory Visit: Payer: Self-pay | Admitting: Family Medicine

## 2020-09-27 ENCOUNTER — Ambulatory Visit (INDEPENDENT_AMBULATORY_CARE_PROVIDER_SITE_OTHER): Payer: No Typology Code available for payment source | Admitting: Clinical

## 2020-09-27 DIAGNOSIS — F419 Anxiety disorder, unspecified: Secondary | ICD-10-CM

## 2020-10-12 ENCOUNTER — Ambulatory Visit (INDEPENDENT_AMBULATORY_CARE_PROVIDER_SITE_OTHER): Payer: No Typology Code available for payment source | Admitting: Clinical

## 2020-10-12 DIAGNOSIS — F419 Anxiety disorder, unspecified: Secondary | ICD-10-CM | POA: Diagnosis not present

## 2020-10-16 ENCOUNTER — Other Ambulatory Visit: Payer: Self-pay

## 2020-10-16 ENCOUNTER — Ambulatory Visit (INDEPENDENT_AMBULATORY_CARE_PROVIDER_SITE_OTHER): Payer: No Typology Code available for payment source

## 2020-10-16 DIAGNOSIS — R748 Abnormal levels of other serum enzymes: Secondary | ICD-10-CM

## 2020-10-17 ENCOUNTER — Ambulatory Visit: Payer: No Typology Code available for payment source

## 2020-10-17 LAB — HEPATIC FUNCTION PANEL
ALT: 23 U/L (ref 0–35)
AST: 24 U/L (ref 0–37)
Albumin: 4.7 g/dL (ref 3.5–5.2)
Alkaline Phosphatase: 55 U/L (ref 47–119)
Bilirubin, Direct: 0.1 mg/dL (ref 0.0–0.3)
Total Bilirubin: 0.6 mg/dL (ref 0.2–1.2)
Total Protein: 7.2 g/dL (ref 6.0–8.3)

## 2020-11-07 ENCOUNTER — Ambulatory Visit: Payer: No Typology Code available for payment source | Admitting: Clinical

## 2020-11-24 ENCOUNTER — Ambulatory Visit: Payer: No Typology Code available for payment source | Admitting: Clinical

## 2021-01-12 ENCOUNTER — Ambulatory Visit (INDEPENDENT_AMBULATORY_CARE_PROVIDER_SITE_OTHER): Payer: No Typology Code available for payment source | Admitting: Clinical

## 2021-01-12 DIAGNOSIS — F419 Anxiety disorder, unspecified: Secondary | ICD-10-CM | POA: Diagnosis not present

## 2021-01-16 ENCOUNTER — Ambulatory Visit (INDEPENDENT_AMBULATORY_CARE_PROVIDER_SITE_OTHER): Payer: No Typology Code available for payment source | Admitting: Clinical

## 2021-01-16 DIAGNOSIS — F419 Anxiety disorder, unspecified: Secondary | ICD-10-CM

## 2021-02-02 ENCOUNTER — Ambulatory Visit (INDEPENDENT_AMBULATORY_CARE_PROVIDER_SITE_OTHER): Payer: No Typology Code available for payment source | Admitting: Clinical

## 2021-02-02 DIAGNOSIS — F419 Anxiety disorder, unspecified: Secondary | ICD-10-CM

## 2021-02-16 ENCOUNTER — Ambulatory Visit (INDEPENDENT_AMBULATORY_CARE_PROVIDER_SITE_OTHER): Payer: No Typology Code available for payment source | Admitting: Clinical

## 2021-02-16 DIAGNOSIS — F419 Anxiety disorder, unspecified: Secondary | ICD-10-CM | POA: Diagnosis not present

## 2021-02-23 ENCOUNTER — Ambulatory Visit (INDEPENDENT_AMBULATORY_CARE_PROVIDER_SITE_OTHER): Payer: No Typology Code available for payment source | Admitting: Clinical

## 2021-02-23 DIAGNOSIS — F419 Anxiety disorder, unspecified: Secondary | ICD-10-CM | POA: Diagnosis not present

## 2021-03-15 ENCOUNTER — Ambulatory Visit: Payer: No Typology Code available for payment source | Admitting: Clinical

## 2021-03-28 ENCOUNTER — Ambulatory Visit (INDEPENDENT_AMBULATORY_CARE_PROVIDER_SITE_OTHER): Payer: No Typology Code available for payment source | Admitting: Clinical

## 2021-03-28 DIAGNOSIS — F419 Anxiety disorder, unspecified: Secondary | ICD-10-CM

## 2021-04-19 ENCOUNTER — Ambulatory Visit (INDEPENDENT_AMBULATORY_CARE_PROVIDER_SITE_OTHER): Payer: No Typology Code available for payment source | Admitting: Clinical

## 2021-04-19 DIAGNOSIS — F411 Generalized anxiety disorder: Secondary | ICD-10-CM | POA: Diagnosis not present

## 2021-04-19 NOTE — Progress Notes (Signed)
Diagnosis: F41.9, Anxiety state unspecified Time of session: 8:00am-8:25am CPT code: [phone]  Marisa Gregory was seen remotely using secure video conferencing. She was in her apartment in [place] and the therapist was in her office at the time of the appointment. She shared that her mood has remained stable overall. Session focused on processing a new relationship she has answered, and considering how to proceed in a manner that prioritizes her mental health. She is scheduled to be seen again in 8 weeks, and will reach out if she needs to be seen sooner.  Objectives Related Problem: Marisa [place] is experiencing symptoms of anxiety, most notably when activities don't go according to plan or as efficiently as she would like Description: Marisa [place] will develop strategies to manage her anxiety and improve her overall mood Target Date: 2021-05-22 Frequency: Monthly Modality: individual Progress: 60%  Planned Intervention: Therapist will help [place]isa [place] to identify and disengage from negative thought patterns using CBT-based strategies  Planned Intervention: Therapist will provide [place]isa [place] with an opportunity to process her experiences in session  Planned Intervention: Therapist will provide [place]isa [place] with strategies to regulate her emotions, including deep breathing, meditation, mindfulness, and self-care  Client Response  Treatment Plan Client Abilities/Strengths  Client presented a motivated, intelligent, and insightful.  Client Treatment Preferences  Client prefers not to miss classes.  Client Statement of Needs  Marisa [place] is seeking CBT to help her manage symptoms of anxiety and related depression.  Treatment Level  Monthly  Symptoms  Anxiety: excessive worry, difficulty relaxing, difficulty delegating responsibility, need for "perfection," distress when plans are disrupted (Status: maintained). Depression: excessive fatigue, depressed mood, disruptions to social relationships (Status: maintained).  Problems  Addressed  New Description, New Description  Goals 1. [place]isa [place] is experiencing symptoms of anxiety, most notably when activities don't go according to plan or as efficiently as she would like Objective [place] will develop strategies to manage her anxiety and improve her overall mood  Target Date: 2021-05-22 Frequency: Monthly  Progress: 60 Modality: individual  Related Interventions Therapist will provide [name] with an opportunity to process her experiences in session Therapist will help Marisa [place] to identify and disengage from negative thought patterns using CBT-based strategies Therapist will provide Marisa [place] with strategies to regulate her emotions, including deep breathing, meditation, mindfulness, and self-care Therapist will engage Marisa [place] in gradual exposure exercises as appropriate Therapist will provide Marisa Gregory with additional resources as appropriate 2. Marisa Gregory reported that she has begun experiencing some symptoms of depression related to her anxiety Diagnosis Axis none 300.00 (Anxiety state, unspecified) - Open - [Signifier: n/a]    Conditions For Discharge Achievement of treatment goals and objectives       Myrtie Cruise, PhD

## 2021-06-14 ENCOUNTER — Ambulatory Visit (INDEPENDENT_AMBULATORY_CARE_PROVIDER_SITE_OTHER): Payer: No Typology Code available for payment source | Admitting: Clinical

## 2021-06-14 DIAGNOSIS — F411 Generalized anxiety disorder: Secondary | ICD-10-CM

## 2021-06-14 NOTE — Progress Notes (Addendum)
Diagnosis: F41.9, Anxiety state unspecified Time of session: 1:00pm-1:46pm CPT code: [id number]  Marisa [place] was seen remotely using secure video conferencing. She was in her apartment in [place] and the therapist was in her office at the time of the appointment. She shared that she had had an eventful few weeks during which she had ended a relationship with a previous interest, and had gotten back together with another. Session focused on processing this, including considering how Marisa [place] will cope if the relationship ends again, and how to broach conversations with her new interest about her concerns regarding the relationship. She is scheduled to be seen again in three weeks, and will reach out if she needs to be seen sooner.  Objectives Related Problem: [place]isa [place] is experiencing symptoms of anxiety, most notably when activities don't go according to plan or as efficiently as she would like Description: [place] will develop strategies to manage her anxiety and improve her overall mood Target Date: 2022-05-22 Frequency: Monthly Modality: individual Progress: 60%  Planned Intervention: Therapist will help [place]isa [place] to identify and disengage from negative thought patterns using CBT-based strategies  Planned Intervention: Therapist will provide [place]isa [place] with an opportunity to process her experiences in session  Planned Intervention: Therapist will provide [place]isa [place] with strategies to regulate her emotions, including deep breathing, meditation, mindfulness, and self-care  Client Response  Treatment Plan Client Abilities/Strengths  Client presented a motivated, intelligent, and insightful.  Client Treatment Preferences  Client prefers not to miss classes.  Client Statement of Needs  Marisa [place] is seeking CBT to help her manage symptoms of anxiety and related depression.  Treatment Level  Monthly  Symptoms  Anxiety: excessive worry, difficulty relaxing, difficulty delegating responsibility, need for  "perfection," distress when plans are disrupted (Status: maintained). Depression: excessive fatigue, depressed mood, disruptions to social relationships (Status: maintained).  Problems Addressed  New Description, New Description  Goals 1. [place]isa [place] is experiencing symptoms of anxiety, most notably when activities don't go according to plan or as efficiently as she would like Objective [place] will develop strategies to manage her anxiety and improve her overall mood  Target Date: 2022-05-22 Frequency: Monthly  Progress: 60 Modality: individual  Related Interventions Therapist will provide [place]isa [place] with an opportunity to process her experiences in session Therapist will help [place]isa [place] to identify and disengage from negative thought patterns using CBT-based strategies Therapist will provide [place]isa [place] with strategies to regulate her emotions, including deep breathing, meditation, mindfulness, and self-care Therapist will engage [place]isa [place] in gradual exposure exercises as appropriate Therapist will provide Marisa [place] with additional resources as appropriate 2. Marisa [place] reported that she has begun experiencing some symptoms of depression related to her anxiety Diagnosis Axis none 300.00 (Anxiety state, unspecified) - Open - [Signifier: n/a]    Conditions For Discharge Achievement of treatment goals and objectives       Marisa Cruise, PhD        Marisa Cruise, PhD

## 2021-07-04 ENCOUNTER — Ambulatory Visit (INDEPENDENT_AMBULATORY_CARE_PROVIDER_SITE_OTHER): Payer: No Typology Code available for payment source | Admitting: Clinical

## 2021-07-04 DIAGNOSIS — F411 Generalized anxiety disorder: Secondary | ICD-10-CM

## 2021-07-04 NOTE — Progress Notes (Signed)
Diagnosis: F41.9, Anxiety state unspecified Time of session: 9:00am-9:58am CPT code: [id number]  Marisa Gregory was seen remotely using secure video conferencing. She was in her apartment in [place] and the therapist was in her office at the time of the appointment. She shared that her relationship had ended. Session focused on processing events surrounding the end of her relationship, and ensuring her safety. She shared that she had not been surprised, and had immediately asked her roommate to remove all sharp objects from the home. She had also made an effort to ensure she is with friends during the days following the break up. She shared some thoughts of self-harm, but has been able to manage these without engaging. She is scheduled to be seen again in one week.   Objectives Related Problem: Marisa Gregory is experiencing symptoms of anxiety, most notably when activities don't go according to plan or as efficiently as she would like Description: Marisa Gregory will develop strategies to manage her anxiety and improve her overall mood Target Date: 2022-05-22 Frequency: Monthly Modality: individual Progress: 60%  Planned Intervention: Therapist will help Marisa Gregory to identify and disengage from negative thought patterns using CBT-based strategies  Planned Intervention: Therapist will provide Marisa Gregory with an opportunity to process her experiences in session  Planned Intervention: Therapist will provide Marisa Gregory with strategies to regulate her emotions, including deep breathing, meditation, mindfulness, and self-care  Client Response  Treatment Plan Client Abilities/Strengths  Client presented a motivated, intelligent, and insightful.  Client Treatment Preferences  Client prefers not to miss classes.  Client Statement of Needs  Marisa Gregory is seeking CBT to help her manage symptoms of anxiety and related depression.  Treatment Level  Monthly  Symptoms  Anxiety: excessive worry, difficulty relaxing, difficulty  delegating responsibility, need for "perfection," distress when plans are disrupted (Status: maintained). Depression: excessive fatigue, depressed mood, disruptions to social relationships (Status: maintained).  Problems Addressed  New Description, New Description  Goals 1. Annisten is experiencing symptoms of anxiety, most notably when activities don't go according to plan or as efficiently as she would like Objective Marisa Gregory will develop strategies to manage her anxiety and improve her overall mood  Target Date: 2022-05-22 Frequency: Monthly  Progress: 60 Modality: individual  Related Interventions Therapist will provide Marisa Gregory with an opportunity to process her experiences in session Therapist will help Marisa Gregory to identify and disengage from negative thought patterns using CBT-based strategies Therapist will provide Marisa Gregory with strategies to regulate her emotions, including deep breathing, meditation, mindfulness, and self-care Therapist will engage Marisa Gregory in gradual exposure exercises as appropriate Therapist will provide Marisa Gregory with additional resources as appropriate 2. Marisa Gregory reported that she has begun experiencing some symptoms of depression related to her anxiety Diagnosis Axis none 300.00 (Anxiety state, unspecified) - Open - [Signifier: n/a]    Conditions For Discharge Achievement of treatment goals and objectives       Marisa Cruise, PhD        Marisa Cruise, PhD               Marisa Cruise, PhD

## 2021-07-11 ENCOUNTER — Ambulatory Visit (INDEPENDENT_AMBULATORY_CARE_PROVIDER_SITE_OTHER): Payer: No Typology Code available for payment source | Admitting: Clinical

## 2021-07-11 DIAGNOSIS — F411 Generalized anxiety disorder: Secondary | ICD-10-CM | POA: Diagnosis not present

## 2021-07-11 NOTE — Progress Notes (Signed)
Diagnosis: F41.9, Anxiety state unspecified ?Time of session: 9:06am-9:52am ?CPT code: [phone] ? ?Manmeet was seen remotely using secure video conferencing. She was in her apartment in [place] and the therapist was in her home at the time of the appointment. She shared that her mood has been stable since her last session, and she has been pleasantly surprised to feel more closure and less sadness related to the end of the relationship with her most recent interest. She has not had thoughts of self harm, and had plans to get a tattoo that day to commemorate one year with no self harm. She reported focusing on school and friendships, and that she is looking forward to plans with several friends over her upcoming spring break. She is scheduled to be seen again on 3/22. ? ? ? ?Objectives ?Related Problem: Mahrosh is experiencing symptoms of anxiety, most notably when activities don't go according to plan or as efficiently as she would like ?Description: [name] will develop strategies to manage her anxiety and improve her overall mood ?Target Date: 2022-05-22 ?Frequency: Monthly ?Modality: individual ?Progress: 60% ? ?Planned Intervention: Therapist will help Gretel to identify and disengage from negative thought patterns using CBT-based strategies  ?Planned Intervention: Therapist will provide Danial with an opportunity to process her experiences in session  ?Planned Intervention: Therapist will provide Luticia with strategies to regulate her emotions, including deep breathing, meditation, mindfulness, and self-care  ?Client Response ? ?Treatment Plan ?Client Abilities/Strengths  ?Client presented a motivated, intelligent, and insightful.  ?Client Treatment Preferences  ?Client prefers not to miss classes.  ?Client Statement of Needs  ?Lindell is seeking CBT to help her manage symptoms of anxiety and related depression.  ?Treatment Level  ?Monthly  ?Symptoms  ?Anxiety: excessive worry, difficulty relaxing, difficulty  delegating responsibility, need for "perfection," distress when plans are disrupted (Status: maintained). Depression: excessive fatigue, depressed mood, disruptions to social relationships (Status: maintained).  ?Problems Addressed  ?New Description, New Description  ?Goals ?1. Solae is experiencing symptoms of anxiety, most notably when activities don't go according to plan or as efficiently as she would like ?Objective ?[name] will develop strategies to manage her anxiety and improve her overall mood  ?Target Date: 2022-05-22 Frequency: Monthly  ?Progress: 60 Modality: individual  ?Related Interventions ?Therapist will provide Danayah with an opportunity to process her experiences in session ?Therapist will help Chayla to identify and disengage from negative thought patterns using CBT-based strategies ?Therapist will provide Karema with strategies to regulate her emotions, including deep breathing, meditation, mindfulness, and self-care ?Therapist will engage Afrah in gradual exposure exercises as appropriate ?Therapist will provide Mayerly with additional resources as appropriate ?2. Baillie reported that she has begun experiencing some symptoms of depression related to her anxiety ?Diagnosis ?Axis none 300.00 (Anxiety state, unspecified) - Open - [Signifier: n/a]    ?Conditions For Discharge ?Achievement of treatment goals and objectives ? ? ? ? ? ? ?Myrtie Cruise, PhD ? ? ? ? ? ? ? ?Myrtie Cruise, PhD ? ? ? ? ? ? ? ? ? ? ? ? ? ? ?Myrtie Cruise, PhD ? ? ? ? ? ? ? ? ? ? ? ? ? ? ?Myrtie Cruise, PhD ?

## 2021-08-01 ENCOUNTER — Ambulatory Visit (INDEPENDENT_AMBULATORY_CARE_PROVIDER_SITE_OTHER): Payer: No Typology Code available for payment source | Admitting: Clinical

## 2021-08-01 DIAGNOSIS — F411 Generalized anxiety disorder: Secondary | ICD-10-CM | POA: Diagnosis not present

## 2021-08-01 NOTE — Progress Notes (Signed)
Diagnosis: F41.9, Anxiety state unspecified ?Time of session: 9:06am-9:52am ?CPT code: [phone] ? ?Montserrath was seen remotely using secure video conferencing. She was in her apartment in [place] and the therapist was in her office at the time of the appointment. She shared that her mood had remained stable and continued to improve since her last session. Session began by processing family dynamics that had been evident while traveling with her family over spring break. She also reflected upon her recent grades, and described herself as focused on her academic performance. She has been on two dates with a new potential interest, but shared that he is at a different school, and this has been a benefit in affording her emotional and physical space to not rush into the relationship and remain focused on her mental health. She is scheduled to be seen again in two weeks. ? ?Objectives ?Related Problem: Shamyah is experiencing symptoms of anxiety, most notably when activities don't go according to plan or as efficiently as she would like ?Description: [name] will develop strategies to manage her anxiety and improve her overall mood ?Target Date: 2022-05-22 ?Frequency: Monthly ?Modality: individual ?Progress: 60% ? ?Planned Intervention: Therapist will help Shontae to identify and disengage from negative thought patterns using CBT-based strategies  ?Planned Intervention: Therapist will provide Jamilyn with an opportunity to process her experiences in session  ?Planned Intervention: Therapist will provide Amatullah with strategies to regulate her emotions, including deep breathing, meditation, mindfulness, and self-care  ?Client Response ? ?Treatment Plan ?Client Abilities/Strengths  ?Client presented a motivated, intelligent, and insightful.  ?Client Treatment Preferences  ?Client prefers not to miss classes.  ?Client Statement of Needs  ?Tanja is seeking CBT to help her manage symptoms of anxiety and related depression.   ?Treatment Level  ?Monthly  ?Symptoms  ?Anxiety: excessive worry, difficulty relaxing, difficulty delegating responsibility, need for "perfection," distress when plans are disrupted (Status: maintained). Depression: excessive fatigue, depressed mood, disruptions to social relationships (Status: maintained).  ?Problems Addressed  ?New Description, New Description  ?Goals ?1. Breyana is experiencing symptoms of anxiety, most notably when activities don't go according to plan or as efficiently as she would like ?Objective ?[name] will develop strategies to manage her anxiety and improve her overall mood  ?Target Date: 2022-05-22 Frequency: Monthly  ?Progress: 60 Modality: individual  ?Related Interventions ?Therapist will provide Kalisa with an opportunity to process her experiences in session ?Therapist will help Timmie to identify and disengage from negative thought patterns using CBT-based strategies ?Therapist will provide Elene with strategies to regulate her emotions, including deep breathing, meditation, mindfulness, and self-care ?Therapist will engage Laniesha in gradual exposure exercises as appropriate ?Therapist will provide Brylei with additional resources as appropriate ?2. Anaiah reported that she has begun experiencing some symptoms of depression related to her anxiety ?Diagnosis ?Axis none 300.00 (Anxiety state, unspecified) - Open - [Signifier: n/a]    ?Conditions For Discharge ?Achievement of treatment goals and objectives ? ? ? ? ? ? ?Myrtie Cruise, PhD ? ? ? ? ? ? ? ?Myrtie Cruise, PhD ? ? ? ? ? ? ? ? ? ? ? ? ? ? ?Myrtie Cruise, PhD ? ? ? ? ? ? ? ? ? ? ? ? ? ? ?Myrtie Cruise, PhD ? ? ? ? ? ? ? ? ? ? ? ? ? ? ?Myrtie Cruise, PhD ?

## 2021-08-13 ENCOUNTER — Ambulatory Visit (INDEPENDENT_AMBULATORY_CARE_PROVIDER_SITE_OTHER): Payer: No Typology Code available for payment source | Admitting: Clinical

## 2021-08-13 DIAGNOSIS — F411 Generalized anxiety disorder: Secondary | ICD-10-CM | POA: Diagnosis not present

## 2021-08-13 NOTE — Progress Notes (Signed)
Diagnosis: F41.9, Anxiety state unspecified ?Time of session: 9:01am-9:53am ?CPT code: [id number] ? ?Marisa Gregory was seen remotely using secure video conferencing. She was in her apartment in [place] and the therapist was in her office at the time of the appointment. She shared that her mood has remained stable. Session focused on her new relationship, and specifically difficulty with trust that she has been experiencing in the wake of her most recent break up. Therapist processed this with her and worked with her to identify coping strategies (painting, focusing on studying, regular walks). She is scheduled to be seen again in two weeks. ? ?Objectives ?Related Problem: Marisa Gregory is experiencing symptoms of anxiety, most notably when activities don't go according to plan or as efficiently as she would like ?Description: Marisa Gregory will develop strategies to manage her anxiety and improve her overall mood ?Target Date: 2022-05-22 ?Frequency: Monthly ?Modality: individual ?Progress: 60% ? ?Planned Intervention: Therapist will help Marisa Gregory to identify and disengage from negative thought patterns using CBT-based strategies  ?Planned Intervention: Therapist will provide Marisa Gregory with an opportunity to process her experiences in session  ?Planned Intervention: Therapist will provide Marisa Gregory with strategies to regulate her emotions, including deep breathing, meditation, mindfulness, and self-care  ?Client Response ? ?Treatment Plan ?Client Abilities/Strengths  ?Client presented a motivated, intelligent, and insightful.  ?Client Treatment Preferences  ?Client prefers not to miss classes.  ?Client Statement of Needs  ?Marisa Gregory is seeking CBT to help her manage symptoms of anxiety and related depression.  ?Treatment Level  ?Monthly  ?Symptoms  ?Anxiety: excessive worry, difficulty relaxing, difficulty delegating responsibility, need for "perfection," distress when plans are disrupted (Status: maintained). Depression: excessive fatigue,  depressed mood, disruptions to social relationships (Status: maintained).  ?Problems Addressed  ?New Description, New Description  ?Goals ?1. Marisa Gregory is experiencing symptoms of anxiety, most notably when activities don't go according to plan or as efficiently as she would like ?Objective ?Marisa Gregory will develop strategies to manage her anxiety and improve her overall mood  ?Target Date: 2022-05-22 Frequency: Monthly  ?Progress: 60 Modality: individual  ?Related Interventions ?Therapist will provide Marisa Gregory with an opportunity to process her experiences in session ?Therapist will help Marisa Gregory to identify and disengage from negative thought patterns using CBT-based strategies ?Therapist will provide Marisa Gregory with strategies to regulate her emotions, including deep breathing, meditation, mindfulness, and self-care ?Therapist will engage Marisa Gregory in gradual exposure exercises as appropriate ?Therapist will provide Marisa Gregory with additional resources as appropriate ?2. Marisa Gregory reported that she has begun experiencing some symptoms of depression related to her anxiety ?Diagnosis ?Axis none 300.00 (Anxiety state, unspecified) - Open - [Signifier: n/a]    ?Conditions For Discharge ?Achievement of treatment goals and objectives ? ? ? ? ? ? ?Marisa Cruise, PhD ? ? ? ? ? ? ? ?Marisa Cruise, PhD ? ? ? ? ? ? ? ? ? ? ? ? ? ? ?Marisa Cruise, PhD ? ? ? ? ? ? ? ? ? ? ? ? ? ? ?Marisa Cruise, PhD ? ? ? ? ? ? ? ? ? ? ? ? ? ? ?Marisa Cruise, PhD ? ? ? ? ? ? ? ? ? ? ? ? ? ? ?Marisa Cruise, PhD ?

## 2021-08-29 ENCOUNTER — Ambulatory Visit: Payer: No Typology Code available for payment source | Admitting: Clinical

## 2021-09-12 ENCOUNTER — Ambulatory Visit (INDEPENDENT_AMBULATORY_CARE_PROVIDER_SITE_OTHER): Payer: No Typology Code available for payment source | Admitting: Clinical

## 2021-09-12 DIAGNOSIS — F411 Generalized anxiety disorder: Secondary | ICD-10-CM

## 2021-09-12 DIAGNOSIS — F331 Major depressive disorder, recurrent, moderate: Secondary | ICD-10-CM

## 2021-09-12 NOTE — Progress Notes (Signed)
Diagnosis: F41.9, Anxiety state unspecified ?Time of session: 9:01am-9:50am ?CPT code: [id number] ? ?Marisa Gregory was seen remotely using secure video conferencing. She was in her apartment in [place] and the therapist was in her office at the time of the appointment. She shared that her mood has been largely stable since her last session, with the exception of a brief depressive episode lasting a few days of the previous week that occurred when there was a delay in having her prescription filled. She experienced some thoughts of self harm during this time, but did not act on them and was able to cope by taking walks, spending time with friends, and crocheting. Suicidal ideation and intent were denied during this episode and currently. She shared details of her new romantic interest and therapist processed this with her, offering validation and support and suggesting coping strategies. She is scheduled to be seen again in one month. ? ? ?Objectives ?Related Problem: Marisa Gregory is experiencing symptoms of anxiety, most notably when activities don't go according to plan or as efficiently as she would like ?Description: Marisa Gregory will develop strategies to manage her anxiety and improve her overall mood ?Target Date: 2022-05-22 ?Frequency: Monthly ?Modality: individual ?Progress: 60% ? ?Planned Intervention: Therapist will help Marisa Gregory to identify and disengage from negative thought patterns using CBT-based strategies  ?Planned Intervention: Therapist will provide Marisa Gregory with an opportunity to process her experiences in session  ?Planned Intervention: Therapist will provide Marisa Gregory with strategies to regulate her emotions, including deep breathing, meditation, mindfulness, and self-care  ?Client Response ? ?Treatment Plan ?Client Abilities/Strengths  ?Client presented a motivated, intelligent, and insightful.  ?Client Treatment Preferences  ?Client prefers not to miss classes.  ?Client Statement of Needs  ?Marisa Gregory is seeking CBT to  help her manage symptoms of anxiety and related depression.  ?Treatment Level  ?Monthly  ?Symptoms  ?Anxiety: excessive worry, difficulty relaxing, difficulty delegating responsibility, need for "perfection," distress when plans are disrupted (Status: maintained). Depression: excessive fatigue, depressed mood, disruptions to social relationships (Status: maintained).  ?Problems Addressed  ?New Description, New Description  ?Goals ?1. Marisa Gregory is experiencing symptoms of anxiety, most notably when activities don't go according to plan or as efficiently as she would like ?Objective ?Marisa Gregory will develop strategies to manage her anxiety and improve her overall mood  ?Target Date: 2022-05-22 Frequency: Monthly  ?Progress: 60 Modality: individual  ?Related Interventions ?Therapist will provide Marisa Gregory with an opportunity to process her experiences in session ?Therapist will help Marisa Gregory to identify and disengage from negative thought patterns using CBT-based strategies ?Therapist will provide Marisa Gregory with strategies to regulate her emotions, including deep breathing, meditation, mindfulness, and self-care ?Therapist will engage Marisa Gregory in gradual exposure exercises as appropriate ?Therapist will provide Marisa Gregory with additional resources as appropriate ?2. Marisa Gregory reported that she has begun experiencing some symptoms of depression related to her anxiety ?Diagnosis ?Axis none 300.00 (Anxiety state, unspecified) - Open - [Signifier: n/a]    ?Conditions For Discharge ?Achievement of treatment goals and objectives ? ? ? ? ? ? ?Myrtie Cruise, PhD ? ? ? ? ? ? ? ?Myrtie Cruise, PhD ? ? ? ? ? ? ? ? ? ? ? ? ? ? ?Myrtie Cruise, PhD ? ? ? ? ? ? ? ? ? ? ? ? ? ? ?Myrtie Cruise, PhD ? ? ? ? ? ? ? ? ? ? ? ? ? ? ?Myrtie Cruise, PhD ? ? ? ? ? ? ? ? ? ? ? ? ? ? ?[place]  Jennette Banker, PhD ? ? ? ? ? ? ? ? ? ? ? ? ? ? ?Myrtie Cruise, PhD ?

## 2021-09-26 ENCOUNTER — Ambulatory Visit: Payer: No Typology Code available for payment source | Admitting: Clinical

## 2021-10-10 ENCOUNTER — Ambulatory Visit: Payer: No Typology Code available for payment source | Admitting: Clinical

## 2021-10-24 ENCOUNTER — Ambulatory Visit: Payer: No Typology Code available for payment source | Admitting: Clinical

## 2021-11-07 ENCOUNTER — Ambulatory Visit: Payer: No Typology Code available for payment source | Admitting: Clinical

## 2021-11-21 ENCOUNTER — Ambulatory Visit: Payer: No Typology Code available for payment source | Admitting: Clinical

## 2021-11-23 ENCOUNTER — Ambulatory Visit (INDEPENDENT_AMBULATORY_CARE_PROVIDER_SITE_OTHER): Payer: No Typology Code available for payment source | Admitting: Clinical

## 2021-11-23 DIAGNOSIS — F411 Generalized anxiety disorder: Secondary | ICD-10-CM

## 2021-11-23 NOTE — Progress Notes (Signed)
Diagnosis: F41.9, Anxiety state unspecified Time of session: 11:01am-12:55 pm CPT code: [id number]  Tavie was seen remotely using secure video conferencing. Asmaa joined the session from a secure location in her cousin's home in [place], [place]: [address], [place], [address]. Therapist was in her office. She shared that her mood has been largely stable since her last session, and her primary anxiety has been in relation to bugs in her apartment. She opted to drop session frequency down to monthly, starting in September.  Objectives Related Problem: Fareeda is experiencing symptoms of anxiety, most notably when activities don't go according to plan or as efficiently as she would like Description: [name] will develop strategies to manage her anxiety and improve her overall mood Target Date: 2022-05-22 Frequency: Monthly Modality: individual Progress: 60%  Planned Intervention: Therapist will help Yecenia to identify and disengage from negative thought patterns using CBT-based strategies  Planned Intervention: Therapist will provide Jalesha with an opportunity to process her experiences in session  Planned Intervention: Therapist will provide Myleen with strategies to regulate her emotions, including deep breathing, meditation, mindfulness, and self-care  Client Response  Treatment Plan Client Abilities/Strengths  Client presented a motivated, intelligent, and insightful.  Client Treatment Preferences  Client prefers not to miss classes.  Client Statement of Needs  Jeneen is seeking CBT to help her manage symptoms of anxiety and related depression.  Treatment Level  Monthly  Symptoms  Anxiety: excessive worry, difficulty relaxing, difficulty delegating responsibility, need for "perfection," distress when plans are disrupted (Status: maintained). Depression: excessive fatigue, depressed mood, disruptions to social relationships (Status: maintained).  Problems Addressed  New  Description, New Description  Goals 1. Eshika is experiencing symptoms of anxiety, most notably when activities don't go according to plan or as efficiently as she would like Objective [name] will develop strategies to manage her anxiety and improve her overall mood  Target Date: 2022-05-22 Frequency: Monthly  Progress: 60 Modality: individual  Related Interventions Therapist will provide Rondell with an opportunity to process her experiences in session Therapist will help Shereda to identify and disengage from negative thought patterns using CBT-based strategies Therapist will provide Sadie with strategies to regulate her emotions, including deep breathing, meditation, mindfulness, and self-care Therapist will engage Camala in gradual exposure exercises as appropriate Therapist will provide Jayonna with additional resources as appropriate 2. Enedelia reported that she has begun experiencing some symptoms of depression related to her anxiety Diagnosis Axis none 300.00 (Anxiety state, unspecified) - Open - [Signifier: n/a]    Conditions For Discharge Achievement of treatment goals and objectives       Myrtie Cruise, PhD        Myrtie Cruise, PhD               Myrtie Cruise, PhD               Myrtie Cruise, PhD               Myrtie Cruise, PhD           Myrtie Cruise, PhD                Myrtie Cruise, PhD

## 2021-12-05 ENCOUNTER — Ambulatory Visit: Payer: No Typology Code available for payment source | Admitting: Clinical

## 2021-12-19 ENCOUNTER — Ambulatory Visit: Payer: No Typology Code available for payment source | Admitting: Clinical

## 2022-01-02 ENCOUNTER — Ambulatory Visit: Payer: No Typology Code available for payment source | Admitting: Clinical

## 2022-01-16 ENCOUNTER — Ambulatory Visit: Payer: No Typology Code available for payment source | Admitting: Clinical

## 2022-01-30 ENCOUNTER — Ambulatory Visit: Payer: No Typology Code available for payment source | Admitting: Clinical

## 2022-01-31 ENCOUNTER — Ambulatory Visit (INDEPENDENT_AMBULATORY_CARE_PROVIDER_SITE_OTHER): Payer: No Typology Code available for payment source | Admitting: Clinical

## 2022-01-31 DIAGNOSIS — F411 Generalized anxiety disorder: Secondary | ICD-10-CM | POA: Diagnosis not present

## 2022-01-31 NOTE — Progress Notes (Signed)
Diagnosis: F41.9, Anxiety state unspecified Time of session: 1:01 pm-1:57 pm CPT code: [id number]-63  Marisa Gregory was seen remotely using secure video conferencing. She was in a new apartment off campus at [facility], and therapist was in her office at the time of the appointment. Marisa Gregory reported that she has been doing well overall, with her main stressor being school related. She reflected upon the last time she had an urge two self-harm, roughly one month ago following an emotional exchange with her boyfriend. She was able to manage the urge without acting on it, and has not experienced urges to self harm since. Therapist suggested strategies to help her protect time for rest, including engaging in a "productive" rest activity for a short period before transitioning to something passive. She is scheduled to be seen again in one month.  Objectives Related Problem: Marisa Gregory is experiencing symptoms of anxiety, most notably when activities don't go according to plan or as efficiently as she would like Description: Marisa Gregory will develop strategies to manage her anxiety and improve her overall mood Target Date: 2022-05-22 Frequency: Monthly Modality: individual Progress: 60%  Planned Intervention: Therapist will help Marisa Gregory to identify and disengage from negative thought patterns using CBT-based strategies  Planned Intervention: Therapist will provide Marisa Gregory with an opportunity to process her experiences in session  Planned Intervention: Therapist will provide Marisa Gregory with strategies to regulate her emotions, including deep breathing, meditation, mindfulness, and self-care  Client Response  Treatment Plan Client Abilities/Strengths  Client presented a motivated, intelligent, and insightful.  Client Treatment Preferences  Client prefers not to miss classes.  Client Statement of Needs  Marisa Gregory is seeking CBT to help her manage symptoms of anxiety and related depression.  Treatment Level  Monthly   Symptoms  Anxiety: excessive worry, difficulty relaxing, difficulty delegating responsibility, need for "perfection," distress when plans are disrupted (Status: maintained). Depression: excessive fatigue, depressed mood, disruptions to social relationships (Status: maintained).  Problems Addressed  New Description, New Description  Goals 1. Marisa Gregory is experiencing symptoms of anxiety, most notably when activities don't go according to plan or as efficiently as she would like Objective Marisa Gregory will develop strategies to manage her anxiety and improve her overall mood  Target Date: 2022-05-22 Frequency: Monthly  Progress: 60 Modality: individual  Related Interventions Therapist will provide Marisa Gregory with an opportunity to process her experiences in session Therapist will help Marisa Gregory to identify and disengage from negative thought patterns using CBT-based strategies Therapist will provide Marisa Gregory with strategies to regulate her emotions, including deep breathing, meditation, mindfulness, and self-care Therapist will engage Marisa Gregory in gradual exposure exercises as appropriate Therapist will provide Marisa Gregory with additional resources as appropriate 2. Marisa Gregory reported that she has begun experiencing some symptoms of depression related to her anxiety Diagnosis Axis none 300.00 (Anxiety state, unspecified) - Open - [Signifier: n/a]    Conditions For Discharge Achievement of treatment goals and objectives       Marisa Cruise, PhD        Marisa Cruise, PhD               Marisa Cruise, PhD               Marisa Cruise, PhD                Marisa Cruise, PhD               Marisa Cruise, PhD

## 2022-02-13 ENCOUNTER — Ambulatory Visit: Payer: No Typology Code available for payment source | Admitting: Clinical

## 2022-02-27 ENCOUNTER — Ambulatory Visit: Payer: No Typology Code available for payment source | Admitting: Clinical

## 2022-02-28 ENCOUNTER — Ambulatory Visit (INDEPENDENT_AMBULATORY_CARE_PROVIDER_SITE_OTHER): Payer: No Typology Code available for payment source | Admitting: Clinical

## 2022-02-28 DIAGNOSIS — F411 Generalized anxiety disorder: Secondary | ICD-10-CM | POA: Diagnosis not present

## 2022-02-28 NOTE — Progress Notes (Signed)
Diagnosis: F41.9, Anxiety state unspecified Time of session: 1:01 pm-1:57 pm CPT code: [id number]  Marisa Gregory was seen remotely using secure video conferencing. She was in a new apartment off campus at [facility], and therapist was in her office at the time of the appointment. Marisa Gregory reported experiencing stress related to uncertainty as to what she will do after college. Therapist provided an opportunity to process, suggesting that she picture her life in 10 years to better understand her big picture priorities. She is scheduled to be seen again in one month.  Objectives Related Problem: Marisa Gregory is experiencing symptoms of anxiety, most notably when activities don't go according to plan or as efficiently as she would like Description: Marisa Gregory will develop strategies to manage her anxiety and improve her overall mood Target Date: 2022-05-22 Frequency: Monthly Modality: individual Progress: 60%  Planned Intervention: Therapist will help Marisa Gregory to identify and disengage from negative thought patterns using CBT-based strategies  Planned Intervention: Therapist will provide Marisa Gregory with an opportunity to process her experiences in session  Planned Intervention: Therapist will provide Marisa Gregory with strategies to regulate her emotions, including deep breathing, meditation, mindfulness, and self-care  Client Response  Treatment Plan Client Abilities/Strengths  Client presented a motivated, intelligent, and insightful.  Client Treatment Preferences  Client prefers not to miss classes.  Client Statement of Needs  Marisa Gregory is seeking CBT to help her manage symptoms of anxiety and related depression.  Treatment Level  Monthly  Symptoms  Anxiety: excessive worry, difficulty relaxing, difficulty delegating responsibility, need for "perfection," distress when plans are disrupted (Status: maintained). Depression: excessive fatigue, depressed mood, disruptions to social relationships (Status: maintained).   Problems Addressed  New Description, New Description  Goals 1. Marisa Gregory is experiencing symptoms of anxiety, most notably when activities don't go according to plan or as efficiently as she would like Objective Marisa Gregory will develop strategies to manage her anxiety and improve her overall mood  Target Date: 2022-05-22 Frequency: Monthly  Progress: 60 Modality: individual  Related Interventions Therapist will provide Marisa Gregory with an opportunity to process her experiences in session Therapist will help Marisa Gregory to identify and disengage from negative thought patterns using CBT-based strategies Therapist will provide Marisa Gregory with strategies to regulate her emotions, including deep breathing, meditation, mindfulness, and self-care Therapist will engage Marisa Gregory in gradual exposure exercises as appropriate Therapist will provide Marisa Gregory with additional resources as appropriate 2. Marisa Gregory reported that she has begun experiencing some symptoms of depression related to her anxiety Diagnosis Axis none 300.00 (Anxiety state, unspecified) - Open - [Signifier: n/a]    Conditions For Discharge Achievement of treatment goals and objectives       Marisa Cruise, PhD        Marisa Cruise, PhD               Marisa Cruise, PhD               Marisa Cruise, PhD                Marisa Cruise, PhD               Marisa Cruise, PhD

## 2022-03-13 ENCOUNTER — Ambulatory Visit: Payer: No Typology Code available for payment source | Admitting: Clinical

## 2022-03-27 ENCOUNTER — Ambulatory Visit: Payer: No Typology Code available for payment source | Admitting: Clinical

## 2022-03-28 ENCOUNTER — Ambulatory Visit: Payer: No Typology Code available for payment source | Admitting: Clinical

## 2022-04-10 ENCOUNTER — Ambulatory Visit: Payer: No Typology Code available for payment source | Admitting: Clinical

## 2022-04-24 ENCOUNTER — Ambulatory Visit: Payer: No Typology Code available for payment source | Admitting: Clinical

## 2022-05-08 ENCOUNTER — Ambulatory Visit: Payer: No Typology Code available for payment source | Admitting: Clinical

## 2022-05-30 ENCOUNTER — Ambulatory Visit (INDEPENDENT_AMBULATORY_CARE_PROVIDER_SITE_OTHER): Payer: 59 | Admitting: Clinical

## 2022-05-30 DIAGNOSIS — F419 Anxiety disorder, unspecified: Secondary | ICD-10-CM | POA: Diagnosis not present

## 2022-05-30 NOTE — Progress Notes (Signed)
Diagnosis: F41.9, Anxiety state unspecified Time of session: 1:01 pm-1:57 pm CPT code: [id number]  Marisa Gregory was seen remotely using secure video conferencing. She was in her apartment off campus at [facility], and therapist was in her office at the time of the appointment. Session focused on events that had transpired in her relationship, and therapist provided an opportunity to process, offering validation and support. She also discussed a recent increase in anxiety, and therapist engaged her in a discussion of adding structure to how she completes her schoolwork to facilitate her ability to allow time for relaxation. She is scheduled to be seen again in one month and will reach out if she needs to be seen sooner.  Objectives Related Problem: Marisa Gregory is experiencing symptoms of anxiety, most notably when activities don't go according to plan or as efficiently as she would like Description: Marisa Gregory will develop strategies to manage her anxiety and improve her overall mood Target Date: 2023-05-23 Frequency: Monthly Modality: individual Progress: 60%  Planned Intervention: Therapist will help Marisa Gregory to identify and disengage from negative thought patterns using CBT-based strategies  Planned Intervention: Therapist will provide Marisa Gregory with an opportunity to process her experiences in session  Planned Intervention: Therapist will provide Marisa Gregory with strategies to regulate her emotions, including deep breathing, meditation, mindfulness, and self-care  Client Response  Treatment Plan Client Abilities/Strengths  Client presented a motivated, intelligent, and insightful.  Client Treatment Preferences  Client prefers not to miss classes.  Client Statement of Needs  Marisa Gregory is seeking CBT to help her manage symptoms of anxiety and related depression.  Treatment Level  Monthly  Symptoms  Anxiety: excessive worry, difficulty relaxing, difficulty delegating responsibility, need for "perfection,"  distress when plans are disrupted (Status: maintained). Depression: excessive fatigue, depressed mood, disruptions to social relationships (Status: maintained).  Problems Addressed  New Description, New Description  Goals 1. Marisa Gregory is experiencing symptoms of anxiety, most notably when activities don't go according to plan or as efficiently as she would like Objective Marisa Gregory will develop strategies to manage her anxiety and improve her overall mood  Target Date: 2023-05-23 Frequency: Monthly  Progress: 60 Modality: individual  Related Interventions Therapist will provide [name] with an opportunity to process her experiences in session Therapist will help Marisa Gregory to identify and disengage from negative thought patterns using CBT-based strategies Therapist will provide Marisa Gregory with strategies to regulate her emotions, including deep breathing, meditation, mindfulness, and self-care Therapist will engage Marisa Gregory in gradual exposure exercises as appropriate Therapist will provide [name] with additional resources as appropriate 2. Marisa Gregory reported that she has begun experiencing some symptoms of depression related to her anxiety Diagnosis Axis none 300.00 (Anxiety state, unspecified) - Open - [Signifier: n/a]    Conditions For Discharge Achievement of treatment goals and objectives       Myrtie Cruise, PhD        Myrtie Cruise, PhD               Myrtie Cruise, PhD               Myrtie Cruise, PhD                  Myrtie Cruise, PhD               Myrtie Cruise, PhD

## 2022-06-27 ENCOUNTER — Ambulatory Visit (INDEPENDENT_AMBULATORY_CARE_PROVIDER_SITE_OTHER): Payer: 59 | Admitting: Clinical

## 2022-06-27 DIAGNOSIS — F419 Anxiety disorder, unspecified: Secondary | ICD-10-CM

## 2022-06-27 NOTE — Progress Notes (Signed)
Diagnosis: F41.9, Anxiety state unspecified Time of session: 1:01 pm-1:57 pm CPT code: [id number]  Marisa Gregory was seen remotely using secure video conferencing. She was in her apartment off campus at [facility], and therapist was in her e at home the time of the appointment. She reported a relative decrease in anxiety, and that she continues to engage in self-care including journaling and reading for leisure. Session focused on processing events in her relationships. She is scheduled to be seen again in one month and will reach out if she needs to be seen sooner.  Objectives Related Problem: Marisa Gregory is experiencing symptoms of anxiety, most notably when activities don't go according to plan or as efficiently as she would like Description: Marisa Gregory will develop strategies to manage her anxiety and improve her overall mood Target Date: 2023-05-23 Frequency: Monthly Modality: individual Progress: 60%  Planned Intervention: Therapist will help Marisa Gregory to identify and disengage from negative thought patterns using CBT-based strategies  Planned Intervention: Therapist will provide Marisa Gregory with an opportunity to process her experiences in session  Planned Intervention: Therapist will provide Marisa Gregory with strategies to regulate her emotions, including deep breathing, meditation, mindfulness, and self-care  Client Response  Treatment Plan Client Abilities/Strengths  Client presented a motivated, intelligent, and insightful.  Client Treatment Preferences  Client prefers not to miss classes.  Client Statement of Needs  Marisa Gregory is seeking CBT to help her manage symptoms of anxiety and related depression.  Treatment Level  Monthly  Symptoms  Anxiety: excessive worry, difficulty relaxing, difficulty delegating responsibility, need for "perfection," distress when plans are disrupted (Status: maintained). Depression: excessive fatigue, depressed mood, disruptions to social relationships (Status: maintained).   Problems Addressed  New Description, New Description  Goals 1. Marisa Gregory is experiencing symptoms of anxiety, most notably when activities don't go according to plan or as efficiently as she would like Objective Marisa Gregory will develop strategies to manage her anxiety and improve her overall mood  Target Date: 2023-05-23 Frequency: Monthly  Progress: 60 Modality: individual  Related Interventions Therapist will provide Marisa Gregory with an opportunity to process her experiences in session Therapist will help Marisa Gregory to identify and disengage from negative thought patterns using CBT-based strategies Therapist will provide Marisa Gregory with strategies to regulate her emotions, including deep breathing, meditation, mindfulness, and self-care Therapist will engage Marisa Gregory in gradual exposure exercises as appropriate Therapist will provide Marisa Gregory with additional resources as appropriate 2. Marisa Gregory reported that she has begun experiencing some symptoms of depression related to her anxiety Diagnosis Axis none 300.00 (Anxiety state, unspecified) - Open - [Signifier: n/a]    Conditions For Discharge Achievement of treatment goals and objectives       Myrtie Cruise, PhD        Myrtie Cruise, PhD               Myrtie Cruise, PhD               Myrtie Cruise, PhD                    Myrtie Cruise, PhD               Myrtie Cruise, PhD

## 2022-08-01 ENCOUNTER — Ambulatory Visit (INDEPENDENT_AMBULATORY_CARE_PROVIDER_SITE_OTHER): Payer: 59 | Admitting: Clinical

## 2022-08-01 DIAGNOSIS — F419 Anxiety disorder, unspecified: Secondary | ICD-10-CM

## 2022-08-01 NOTE — Progress Notes (Signed)
Diagnosis: F41.9, Anxiety state unspecified Time of session: 1:01 pm-1:57 pm CPT code: [id number]  Marisa Gregory was seen remotely using secure video conferencing. She was in her apartment off campus at [facility], and therapist was in her e at home the time of the appointment. 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 Therapist offered validation and support, reframing several cognitive distortions and encouraging her to recall times when she has faced uncertainty and unexpected events and experienced good outcomes. She is scheduled to be seen again in one month and will reach out if she needs to be seen sooner.  Objectives Related Problem: Marisa Gregory is experiencing symptoms of anxiety, most notably when activities don't go according to plan or as efficiently as she would like Description: Marisa Gregory will develop strategies to manage her anxiety and improve her overall mood Target Date: 2023-05-23 Frequency: Monthly Modality: individual Progress: 60%  Planned Intervention: Therapist will help Marisa Gregory to identify and disengage from negative thought patterns using CBT-based strategies  Planned Intervention: Therapist will provide Marisa Gregory with an opportunity to process her experiences in session  Planned Intervention: Therapist will provide Marisa Gregory with strategies to regulate her emotions, including deep breathing, meditation, mindfulness, and self-care  Client Response  Treatment Plan Client Abilities/Strengths  Client presented a motivated, intelligent, and insightful.  Client Treatment Preferences  Client prefers not to miss classes.  Client Statement of Needs  Marisa Gregory is seeking CBT to help her manage symptoms of anxiety and related depression.  Treatment Level  Monthly  Symptoms  Anxiety: excessive worry, difficulty relaxing, difficulty delegating responsibility, need for "perfection," distress when plans are disrupted (Status:  maintained). Depression: excessive fatigue, depressed mood, disruptions to social relationships (Status: maintained).  Problems Addressed  New Description, New Description  Goals 1. Marisa Gregory is experiencing symptoms of anxiety, most notably when activities don't go according to plan or as efficiently as she would like Objective Marisa Gregory will develop strategies to manage her anxiety and improve her overall mood  Target Date: 2023-05-23 Frequency: Monthly  Progress: 60 Modality: individual  Related Interventions Therapist will provide Marisa Gregory with an opportunity to process her experiences in session Therapist will help Marisa Gregory to identify and disengage from negative thought patterns using CBT-based strategies Therapist will provide Marisa Gregory with strategies to regulate her emotions, including deep breathing, meditation, mindfulness, and self-care Therapist will engage Marisa Gregory in gradual exposure exercises as appropriate Therapist will provide Marisa Gregory with additional resources as appropriate 2. Marisa Gregory reported that she has begun experiencing some symptoms of depression related to her anxiety Diagnosis Axis none 300.00 (Anxiety state, unspecified) - Open - [Signifier: n/a]    Conditions For Discharge Achievement of treatment goals and objectives   Marisa Cruise, PhD               Marisa Cruise, PhD

## 2022-08-29 ENCOUNTER — Ambulatory Visit (INDEPENDENT_AMBULATORY_CARE_PROVIDER_SITE_OTHER): Payer: 59 | Admitting: Clinical

## 2022-08-29 DIAGNOSIS — F419 Anxiety disorder, unspecified: Secondary | ICD-10-CM | POA: Diagnosis not present

## 2022-08-29 NOTE — Progress Notes (Signed)
Diagnosis: F41.9, Anxiety state unspecified Time of session: 1:01 pm-1:57 pm CPT code: [id number]-04  Marisa Gregory was seen remotely using secure video conferencing. She was in her apartment off campus at [facility], and therapist was in her at home the time of the appointment. She reported an increase in depression and anxiety in recent weeks, including ideation of self-harm (cutting), but she did not act on it and did not experience suicidal ideation. She reflected upon recent events in her relationship that had contributed to her decrease in mood. She is scheduled to be seen again in two weeks and will reach out if she needs to be seen sooner.  Objectives Related Problem: Marisa Gregory is experiencing symptoms of anxiety, most notably when activities don't go according to plan or as efficiently as she would like Description: Marisa Gregory will develop strategies to manage her anxiety and improve her overall mood Target Date: 2023-05-23 Frequency: Monthly Modality: individual Progress: 60%  Planned Intervention: Therapist will help Marisa Gregory to identify and disengage from negative thought patterns using CBT-based strategies  Planned Intervention: Therapist will provide Marisa Gregory with an opportunity to process her experiences in session  Planned Intervention: Therapist will provide Marisa Gregory with strategies to regulate her emotions, including deep breathing, meditation, mindfulness, and self-care  Client Response  Treatment Plan Client Abilities/Strengths  Client presented a motivated, intelligent, and insightful.  Client Treatment Preferences  Client prefers not to miss classes.  Client Statement of Needs  Marisa Gregory is seeking CBT to help her manage symptoms of anxiety and related depression.  Treatment Level  Monthly  Symptoms  Anxiety: excessive worry, difficulty relaxing, difficulty delegating responsibility, need for "perfection," distress when plans are disrupted (Status: maintained). Depression: excessive  fatigue, depressed mood, disruptions to social relationships (Status: maintained).  Problems Addressed  New Description, New Description  Goals 1. Marisa Gregory is experiencing symptoms of anxiety, most notably when activities don't go according to plan or as efficiently as she would like Objective Marisa Gregory will develop strategies to manage her anxiety and improve her overall mood  Target Date: 2023-05-23 Frequency: Monthly  Progress: 60 Modality: individual  Related Interventions Therapist will provide Marisa Gregory with an opportunity to process her experiences in session Therapist will help Marisa Gregory to identify and disengage from negative thought patterns using CBT-based strategies Therapist will provide Marisa Gregory with strategies to regulate her emotions, including deep breathing, meditation, mindfulness, and self-care Therapist will engage Marisa Gregory in gradual exposure exercises as appropriate Therapist will provide Marisa Gregory with additional resources as appropriate 2. Marisa Gregory reported that she has begun experiencing some symptoms of depression related to her anxiety Diagnosis Axis none 300.00 (Anxiety state, unspecified) - Open - [Signifier: n/a]    Conditions For Discharge Achievement of treatment goals and objectives       Marisa Noa, PhD               Marisa Noa, PhD

## 2022-09-11 ENCOUNTER — Ambulatory Visit (INDEPENDENT_AMBULATORY_CARE_PROVIDER_SITE_OTHER): Payer: 59 | Admitting: Clinical

## 2022-09-11 DIAGNOSIS — F419 Anxiety disorder, unspecified: Secondary | ICD-10-CM | POA: Diagnosis not present

## 2022-09-11 NOTE — Progress Notes (Signed)
Diagnosis: F41.9, Anxiety state unspecified Time of session: 12:01 pm-12:57 pm CPT code: 21308M-57  Marisa Gregory was seen remotely using secure video conferencing. She was in her apartment off campus at [facility], and therapist was in her at home the time of the appointment. She reported an overall improvement in overall mood, which she attributed to learning that she did not receive an internship for the summer. She expressed relief at being able to make a plan, and intends to work at her father's company. She reported otherwise feeling good, and that she is looking forward to her upcoming [place] cruise. She is scheduled to be seen again in one month and will reach out if she needs to be seen sooner.  Objectives Related Problem: Marisa Gregory is experiencing symptoms of anxiety, most notably when activities don't go according to plan or as efficiently as she would like Description: Marisa Gregory will develop strategies to manage her anxiety and improve her overall mood Target Date: 2023-05-23 Frequency: Monthly Modality: individual Progress: 60%  Planned Intervention: Therapist will help Marisa Gregory to identify and disengage from negative thought patterns using CBT-based strategies  Planned Intervention: Therapist will provide Marisa Gregory with an opportunity to process her experiences in session  Planned Intervention: Therapist will provide Marisa Gregory with strategies to regulate her emotions, including deep breathing, meditation, mindfulness, and self-care  Client Response  Treatment Plan Client Abilities/Strengths  Client presented a motivated, intelligent, and insightful.  Client Treatment Preferences  Client prefers not to miss classes.  Client Statement of Needs  [name] is seeking CBT to help her manage symptoms of anxiety and related depression.  Treatment Level  Monthly  Symptoms  Anxiety: excessive worry, difficulty relaxing, difficulty delegating responsibility, need for "perfection," distress when  plans are disrupted (Status: maintained). Depression: excessive fatigue, depressed mood, disruptions to social relationships (Status: maintained).  Problems Addressed  New Description, New Description  Goals 1. [name] is experiencing symptoms of anxiety, most notably when activities don't go according to plan or as efficiently as she would like Objective Marisa Gregory will develop strategies to manage her anxiety and improve her overall mood  Target Date: 2023-05-23 Frequency: Monthly  Progress: 60 Modality: individual  Related Interventions Therapist will provide Marisa Gregory with an opportunity to process her experiences in session Therapist will help Marisa Gregory to identify and disengage from negative thought patterns using CBT-based strategies Therapist will provide Marisa Gregory with strategies to regulate her emotions, including deep breathing, meditation, mindfulness, and self-care Therapist will engage Marisa Gregory in gradual exposure exercises as appropriate Therapist will provide Marisa Gregory with additional resources as appropriate 2. Marisa Gregory reported that she has begun experiencing some symptoms of depression related to her anxiety Diagnosis Axis none 300.00 (Anxiety state, unspecified) - Open - [Signifier: n/a]    Conditions For Discharge Achievement of treatment goals and objectives           Chrissie Noa, PhD               Chrissie Noa, PhD

## 2022-09-26 ENCOUNTER — Ambulatory Visit: Payer: No Typology Code available for payment source | Admitting: Clinical

## 2022-10-18 ENCOUNTER — Ambulatory Visit (INDEPENDENT_AMBULATORY_CARE_PROVIDER_SITE_OTHER): Payer: 59 | Admitting: Clinical

## 2022-10-18 DIAGNOSIS — F411 Generalized anxiety disorder: Secondary | ICD-10-CM | POA: Diagnosis not present

## 2022-10-18 NOTE — Progress Notes (Signed)
Diagnosis: F41.9, Anxiety state unspecified Time of session: 12:01 pm-12:57 pm CPT code: 29562Z-30  Merina was seen remotely using secure video conferencing. She was in her apartment off campus at [facility], and therapist was in her at home the time of the appointment. She reported sustained improvement in mood, having had a good time on her cruise. Session focused on processing stressors in her new work environment, as well as continuing to explore psychological factors that may contribute to her physical sickness in response to alcohol. She reported symptoms had diminished on her cruise, causing her to further suspect it may be stress related. Therapist encouraged her to track symptoms and try intentionally using relaxation exercises prior to any alcohol consumption. She is scheduled to be seen again in August and therapist will reach out as cancellations happen.  Objectives Related Problem: Remell is experiencing symptoms of anxiety, most notably when activities don't go according to plan or as efficiently as she would like Description: Fleeta Emmer will develop strategies to manage her anxiety and improve her overall mood Target Date: 2023-05-23 Frequency: Monthly Modality: individual Progress: 60%  Planned Intervention: Therapist will help Aracelys to identify and disengage from negative thought patterns using CBT-based strategies  Planned Intervention: Therapist will provide Valori with an opportunity to process her experiences in session  Planned Intervention: Therapist will provide Carmela with strategies to regulate her emotions, including deep breathing, meditation, mindfulness, and self-care  Client Response  Treatment Plan Client Abilities/Strengths  Client presented a motivated, intelligent, and insightful.  Client Treatment Preferences  Client prefers not to miss classes.  Client Statement of Needs  Oriyah is seeking CBT to help her manage symptoms of anxiety and related  depression.  Treatment Level  Monthly  Symptoms  Anxiety: excessive worry, difficulty relaxing, difficulty delegating responsibility, need for "perfection," distress when plans are disrupted (Status: maintained). Depression: excessive fatigue, depressed mood, disruptions to social relationships (Status: maintained).  Problems Addressed  New Description, New Description  Goals 1. Angelize is experiencing symptoms of anxiety, most notably when activities don't go according to plan or as efficiently as she would like Objective Fleeta Emmer will develop strategies to manage her anxiety and improve her overall mood  Target Date: 2023-05-23 Frequency: Monthly  Progress: 60 Modality: individual  Related Interventions Therapist will provide Andreanna with an opportunity to process her experiences in session Therapist will help Mikeila to identify and disengage from negative thought patterns using CBT-based strategies Therapist will provide Janae with strategies to regulate her emotions, including deep breathing, meditation, mindfulness, and self-care Therapist will engage [place] in gradual exposure exercises as appropriate Therapist will provide Loralei with additional resources as appropriate 2. Mckena reported that she has begun experiencing some symptoms of depression related to her anxiety Diagnosis Axis none 300.00 (Anxiety state, unspecified) - Open - [Signifier: n/a]    Conditions For Discharge Achievement of treatment goals and objectives    Chrissie Noa, PhD               Chrissie Noa, PhD

## 2022-10-31 ENCOUNTER — Ambulatory Visit: Payer: No Typology Code available for payment source | Admitting: Clinical

## 2022-11-28 ENCOUNTER — Ambulatory Visit: Payer: No Typology Code available for payment source | Admitting: Clinical

## 2022-12-13 ENCOUNTER — Ambulatory Visit: Payer: 59 | Admitting: Clinical

## 2022-12-13 DIAGNOSIS — F419 Anxiety disorder, unspecified: Secondary | ICD-10-CM | POA: Diagnosis not present

## 2022-12-13 NOTE — Progress Notes (Signed)
Diagnosis: F41.9, Anxiety state unspecified Time of session: 3:01 pm-3:57 pm CPT code: 11914N-82  Amylee was seen remotely using secure video conferencing. She was in her apartment off campus at [facility], and therapist was in her at office the time of the appointment. Client is aware of risks of telehealth and consented to a virtual visit. 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 Therapist engaged her in reflection about her college experience, including thinking of worries she had when she first started and whether any of them had come to fruition. She is scheduled to be seen again in one month.  Objectives Related Problem: Dorothee is experiencing symptoms of anxiety, most notably when activities don't go according to plan or as efficiently as she would like Description: Fleeta Emmer will develop strategies to manage her anxiety and improve her overall mood Target Date: 2023-05-23 Frequency: Monthly Modality: individual Progress: 60%  Planned Intervention: Therapist will help Kilyn to identify and disengage from negative thought patterns using CBT-based strategies  Planned Intervention: Therapist will provide Nella with an opportunity to process her experiences in session  Planned Intervention: Therapist will provide Aiyonna with strategies to regulate her emotions, including deep breathing, meditation, mindfulness, and self-care  Client Response  Treatment Plan Client Abilities/Strengths  Client presented a motivated, intelligent, and insightful.  Client Treatment Preferences  Client prefers not to miss classes.  Client Statement of Needs  Mayukha is seeking CBT to help her manage symptoms of anxiety and related depression.  Treatment Level  Monthly  Symptoms  Anxiety: excessive worry, difficulty relaxing, difficulty delegating responsibility, need for "perfection," distress when plans are disrupted (Status:  maintained). Depression: excessive fatigue, depressed mood, disruptions to social relationships (Status: maintained).  Problems Addressed  New Description, New Description  Goals 1. Loni is experiencing symptoms of anxiety, most notably when activities don't go according to plan or as efficiently as she would like Objective Fleeta Emmer will develop strategies to manage her anxiety and improve her overall mood  Target Date: 2023-05-23 Frequency: Monthly  Progress: 60 Modality: individual  Related Interventions Therapist will provide Christina with an opportunity to process her experiences in session Therapist will help Jacquelina to identify and disengage from negative thought patterns using CBT-based strategies Therapist will provide Jocelynn with strategies to regulate her emotions, including deep breathing, meditation, mindfulness, and self-care Therapist will engage Melynda in gradual exposure exercises as appropriate Therapist will provide Lauran with additional resources as appropriate 2. Lachina reported that she has begun experiencing some symptoms of depression related to her anxiety Diagnosis Axis none 300.00 (Anxiety state, unspecified) - Open - [Signifier: n/a]    Conditions For Discharge Achievement of treatment goals and objectives    Chrissie Noa, PhD               Chrissie Noa, PhD

## 2022-12-26 ENCOUNTER — Ambulatory Visit: Payer: No Typology Code available for payment source | Admitting: Clinical

## 2023-01-15 ENCOUNTER — Ambulatory Visit: Payer: 59 | Admitting: Clinical

## 2023-01-17 ENCOUNTER — Ambulatory Visit (INDEPENDENT_AMBULATORY_CARE_PROVIDER_SITE_OTHER): Payer: 59 | Admitting: Clinical

## 2023-01-17 DIAGNOSIS — F419 Anxiety disorder, unspecified: Secondary | ICD-10-CM

## 2023-01-17 NOTE — Progress Notes (Signed)
Diagnosis: F41.9, Anxiety state unspecified Time of session: 4:01 pm-4:57 pm CPT code: 86578I-69  Marisa Gregory was seen remotely using secure video conferencing. She was in her apartment off campus at [facility], and therapist was in her at office the time of the appointment. Client is aware of risks of telehealth and consented to a virtual visit. Marisa Gregory had reached out to request a session via secure message due to increased anxiety. She reported having experienced intrusive thoughts pertaining to her relationship. Therapist encouraged her to discuss medication management for anxiety with her psychiatrist. Therapist also worked with her to explore how she can return to self-care practices (e.g., exercise, crocheting) that have helped in the past. She is scheduled to be seen again in two weeks.   Objectives Related Problem: Marisa Gregory is experiencing symptoms of anxiety, most notably when activities don't go according to plan or as efficiently as she would like Description: Marisa Gregory will develop strategies to manage her anxiety and improve her overall mood Target Date: 2023-05-23 Frequency: Monthly Modality: individual Progress: 60%  Planned Intervention: Therapist will help Marisa Gregory to identify and disengage from negative thought patterns using CBT-based strategies  Planned Intervention: Therapist will provide Marisa Gregory with an opportunity to process her experiences in session  Planned Intervention: Therapist will provide Marisa Gregory with strategies to regulate her emotions, including deep breathing, meditation, mindfulness, and self-care  Client Response  Treatment Plan Client Abilities/Strengths  Client presented a motivated, intelligent, and insightful.  Client Treatment Preferences  Client prefers not to miss classes.  Client Statement of Needs  Marisa Gregory is seeking CBT to help her manage symptoms of anxiety and related depression.  Treatment Level  Monthly  Symptoms  Anxiety: excessive worry,  difficulty relaxing, difficulty delegating responsibility, need for "perfection," distress when plans are disrupted (Status: maintained). Depression: excessive fatigue, depressed mood, disruptions to social relationships (Status: maintained).  Problems Addressed  New Description, New Description  Goals 1. [name] is experiencing symptoms of anxiety, most notably when activities don't go according to plan or as efficiently as she would like Objective Marisa Gregory will develop strategies to manage her anxiety and improve her overall mood  Target Date: 2023-05-23 Frequency: Monthly  Progress: 60 Modality: individual  Related Interventions Therapist will provide Marisa Gregory with an opportunity to process her experiences in session Therapist will help Marisa Gregory to identify and disengage from negative thought patterns using CBT-based strategies Therapist will provide Marisa Gregory with strategies to regulate her emotions, including deep breathing, meditation, mindfulness, and self-care Therapist will engage Marisa Gregory in gradual exposure exercises as appropriate Therapist will provide Marisa Gregory with additional resources as appropriate 2. Marisa Gregory reported that she has begun experiencing some symptoms of depression related to her anxiety Diagnosis Axis none 300.00 (Anxiety state, unspecified) - Open - [Signifier: n/a]    Conditions For Discharge Achievement of treatment goals and objectives    Marisa Noa, PhD               Marisa Noa, PhD               Marisa Noa, PhD

## 2023-01-30 ENCOUNTER — Ambulatory Visit (INDEPENDENT_AMBULATORY_CARE_PROVIDER_SITE_OTHER): Payer: 59 | Admitting: Clinical

## 2023-01-30 DIAGNOSIS — F419 Anxiety disorder, unspecified: Secondary | ICD-10-CM

## 2023-01-30 NOTE — Progress Notes (Signed)
Diagnosis: F41.9, Anxiety state unspecified Time of session: 1:01 pm-1:57 pm CPT code: 28413K-44  Marisa Gregory was seen remotely using secure video conferencing. She was in her apartment off campus at [facility], and therapist was in her at office the time of the appointment. Client is aware of risks of telehealth and consented to a virtual visit. Marisa Gregory reported continued generalized anxiety that she does not believe is triggered by circumstances in her life. She had had an appointment with her psychiatrist, and a plan had been created to start taking abilify. Therapist also worked with her to explore behavioral strategies, including maintaining a consistent schedule, incorporating exercise, and prioritizing self care. She is scheduled to be seen again on 10/8 and will reach out if she needs to be seen sooner.  Objectives Related Problem: Marisa Gregory is experiencing symptoms of anxiety, most notably when activities don't go according to plan or as efficiently as she would like Description: Marisa Gregory will develop strategies to manage her anxiety and improve her overall mood Target Date: 2023-05-23 Frequency: Monthly Modality: individual Progress: 60%  Planned Intervention: Therapist will help Marisa Gregory to identify and disengage from negative thought patterns using CBT-based strategies  Planned Intervention: Therapist will provide Marisa Gregory with an opportunity to process her experiences in session  Planned Intervention: Therapist will provide [name] with strategies to regulate her emotions, including deep breathing, meditation, mindfulness, and self-care  Client Response  Treatment Plan Client Abilities/Strengths  Client presented a motivated, intelligent, and insightful.  Client Treatment Preferences  Client prefers not to miss classes.  Client Statement of Needs  Marisa Gregory is seeking CBT to help her manage symptoms of anxiety and related depression.  Treatment Level  Monthly  Symptoms  Anxiety: excessive  worry, difficulty relaxing, difficulty delegating responsibility, need for "perfection," distress when plans are disrupted (Status: maintained). Depression: excessive fatigue, depressed mood, disruptions to social relationships (Status: maintained).  Problems Addressed  New Description, New Description  Goals 1. Marisa Gregory is experiencing symptoms of anxiety, most notably when activities don't go according to plan or as efficiently as she would like Objective Marisa Gregory will develop strategies to manage her anxiety and improve her overall mood  Target Date: 2023-05-23 Frequency: Monthly  Progress: 60 Modality: individual  Related Interventions Therapist will provide Marisa Gregory with an opportunity to process her experiences in session Therapist will help Marisa Gregory to identify and disengage from negative thought patterns using CBT-based strategies Therapist will provide Marisa Gregory with strategies to regulate her emotions, including deep breathing, meditation, mindfulness, and self-care Therapist will engage Marisa Gregory in gradual exposure exercises as appropriate Therapist will provide Marisa Gregory with additional resources as appropriate 2. Marisa Gregory reported that she has begun experiencing some symptoms of depression related to her anxiety Diagnosis Axis none 300.00 (Anxiety state, unspecified) - Open - [Signifier: n/a]    Conditions For Discharge Achievement of treatment goals and objectives    Chrissie Noa, PhD               Chrissie Noa, PhD               Chrissie Noa, PhD               Chrissie Noa, PhD

## 2023-02-18 ENCOUNTER — Ambulatory Visit (INDEPENDENT_AMBULATORY_CARE_PROVIDER_SITE_OTHER): Payer: 59 | Admitting: Clinical

## 2023-02-18 DIAGNOSIS — F419 Anxiety disorder, unspecified: Secondary | ICD-10-CM | POA: Diagnosis not present

## 2023-02-18 NOTE — Progress Notes (Signed)
Diagnosis: F41.9, Anxiety state unspecified Time of session: 9:01 am-9:54 pm CPT code: 72536U-44  Marisa Gregory was seen remotely using secure video conferencing. She was in her apartment off campus at [facility], and therapist was in her at office the time of the appointment. Client is aware of risks of telehealth and consented to a virtual visit. Marisa Gregory reported that she has been less bothered by anxiety since her last session. She has not noted a marked change since starting abilify, and speculated that it could be due to being very busy. She has also taken up running regularly, in preparation for a race in the spring. She indicated a plan to continue running, and is scheduled to be seen again in two weeks.  Objectives Related Problem: Marisa Gregory is experiencing symptoms of anxiety, most notably when activities don't go according to plan or as efficiently as she would like Description: Marisa Gregory will develop strategies to manage her anxiety and improve her overall mood Target Date: 2023-05-23 Frequency: Monthly Modality: individual Progress: 60%  Planned Intervention: Therapist will help Marisa Gregory to identify and disengage from negative thought patterns using CBT-based strategies  Planned Intervention: Therapist will provide Marisa Gregory with an opportunity to process her experiences in session  Planned Intervention: Therapist will provide Marisa Gregory with strategies to regulate her emotions, including deep breathing, meditation, mindfulness, and self-care  Client Response  Treatment Plan Client Abilities/Strengths  Client presented a motivated, intelligent, and insightful.  Client Treatment Preferences  Client prefers not to miss classes.  Client Statement of Needs  Marisa Gregory is seeking CBT to help her manage symptoms of anxiety and related depression.  Treatment Level  Monthly  Symptoms  Anxiety: excessive worry, difficulty relaxing, difficulty delegating responsibility, need for "perfection," distress when  plans are disrupted (Status: maintained). Depression: excessive fatigue, depressed mood, disruptions to social relationships (Status: maintained).  Problems Addressed  New Description, New Description  Goals 1. Marisa Gregory is experiencing symptoms of anxiety, most notably when activities don't go according to plan or as efficiently as she would like Objective Marisa Gregory will develop strategies to manage her anxiety and improve her overall mood  Target Date: 2023-05-23 Frequency: Monthly  Progress: 60 Modality: individual  Related Interventions Therapist will provide Marisa Gregory with an opportunity to process her experiences in session Therapist will help Marisa Gregory to identify and disengage from negative thought patterns using CBT-based strategies Therapist will provide Marisa Gregory with strategies to regulate her emotions, including deep breathing, meditation, mindfulness, and self-care Therapist will engage Marisa Gregory in gradual exposure exercises as appropriate Therapist will provide Marisa Gregory with additional resources as appropriate 2. Marisa Gregory reported that she has begun experiencing some symptoms of depression related to her anxiety Diagnosis Axis none 300.00 (Anxiety state, unspecified) - Open - [Signifier: n/a]    Conditions For Discharge Achievement of treatment goals and objectives   Chrissie Noa, PhD               Chrissie Noa, PhD

## 2023-02-28 ENCOUNTER — Ambulatory Visit: Payer: 59 | Admitting: Clinical

## 2023-03-06 ENCOUNTER — Ambulatory Visit: Payer: 59 | Admitting: Clinical

## 2023-03-06 DIAGNOSIS — F419 Anxiety disorder, unspecified: Secondary | ICD-10-CM | POA: Diagnosis not present

## 2023-03-06 NOTE — Progress Notes (Signed)
Diagnosis: F41.9, Anxiety state unspecified Time of session: 10:01 am-10:59 am CPT code: [id number]-91  Marisa Gregory was seen remotely using secure video conferencing. She was in her apartment off campus at [facility], and therapist was in her at office the time of the appointment. Client is aware of risks of telehealth and consented to a virtual visit. Marisa Gregory reported being very busy with schoolwork. Therapist offered an opportunity to process. She continues to exercise and has found support among other students with the same major. Therapist encouraged her to consider other emotional experiences she might want to have, and how to invite those in. She is scheduled to be seen again in three weeks.   Objectives Related Problem: Marisa Gregory is experiencing symptoms of anxiety, most notably when activities don't go according to plan or as efficiently as she would like Description: Marisa Gregory will develop strategies to manage her anxiety and improve her overall mood Target Date: 2023-05-23 Frequency: Monthly Modality: individual Progress: 60%  Planned Intervention: Therapist will help Marisa Gregory to identify and disengage from negative thought patterns using CBT-based strategies  Planned Intervention: Therapist will provide Marisa Gregory with an opportunity to process her experiences in session  Planned Intervention: Therapist will provide Marisa Gregory with strategies to regulate her emotions, including deep breathing, meditation, mindfulness, and self-care  Client Response  Treatment Plan Client Abilities/Strengths  Client presented a motivated, intelligent, and insightful.  Client Treatment Preferences  Client prefers not to miss classes.  Client Statement of Needs  Marisa Gregory is seeking CBT to help her manage symptoms of anxiety and related depression.  Treatment Level  Monthly  Symptoms  Anxiety: excessive worry, difficulty relaxing, difficulty delegating responsibility, need for "perfection," distress when plans are  disrupted (Status: maintained). Depression: excessive fatigue, depressed mood, disruptions to social relationships (Status: maintained).  Problems Addressed  New Description, New Description  Goals 1. Marisa Gregory is experiencing symptoms of anxiety, most notably when activities don't go according to plan or as efficiently as she would like Objective Marisa Gregory will develop strategies to manage her anxiety and improve her overall mood  Target Date: 2023-05-23 Frequency: Monthly  Progress: 60 Modality: individual  Related Interventions Therapist will provide Marisa Gregory with an opportunity to process her experiences in session Therapist will help Marisa Gregory to identify and disengage from negative thought patterns using CBT-based strategies Therapist will provide Damie with strategies to regulate her emotions, including deep breathing, meditation, mindfulness, and self-care Therapist will engage Marisa Gregory in gradual exposure exercises as appropriate Therapist will provide Marisa Gregory with additional resources as appropriate 2. Marisa Gregory reported that she has begun experiencing some symptoms of depression related to her anxiety Diagnosis Axis none 300.00 (Anxiety state, unspecified) - Open - [Signifier: n/a]    Conditions For Discharge Achievement of treatment goals and objectives   Chrissie Noa, PhD               Chrissie Noa, PhD

## 2023-03-27 ENCOUNTER — Ambulatory Visit: Payer: 59 | Admitting: Clinical

## 2023-03-27 DIAGNOSIS — F419 Anxiety disorder, unspecified: Secondary | ICD-10-CM | POA: Diagnosis not present

## 2023-03-27 NOTE — Progress Notes (Signed)
Diagnosis: F41.9, Anxiety state unspecified Time of session: 1:01 pm-1:56 pm CPT code: 16109U-04  Marisa Gregory was seen remotely using secure video conferencing. She was in her apartment off campus at [facility], and therapist was in her at office the time of the appointment. Marisa Gregory is aware of risks of telehealth and consented to a virtual visit. Marisa Gregory reported a continued increase in anxiety. Suicidal ideation and intent were denied. Session included processing her feelings about the election in light of the fact that her boyfriend has different political beliefs than she does. Therapist also suggested strategies to help her create a sense of completion and allow herself to relax after completing work. She is scheduled to be seen again in three weeks, and will reach out if she needs to be seen sooner.  Objectives Related Problem: Marisa Gregory is experiencing symptoms of anxiety, most notably when activities don't go according to plan or as efficiently as she would like Description: Marisa Gregory will develop strategies to manage her anxiety and improve her overall mood Target Date: 2023-05-23 Frequency: Monthly Modality: individual Progress: 60%  Planned Intervention: Therapist will help Marisa Gregory to identify and disengage from negative thought patterns using CBT-based strategies  Planned Intervention: Therapist will provide Marisa Gregory with an opportunity to process her experiences in session  Planned Intervention: Therapist will provide Marisa Gregory with strategies to regulate her emotions, including deep breathing, meditation, mindfulness, and self-care  Marisa Gregory Response  Treatment Plan Marisa Gregory Abilities/Strengths  Marisa Gregory presented a motivated, intelligent, and insightful.  Marisa Gregory Treatment Preferences  Marisa Gregory prefers not to miss classes.  Marisa Gregory Statement of Needs  Marisa Gregory is seeking CBT to help her manage symptoms of anxiety and related depression.  Treatment Level  Monthly  Symptoms  Anxiety: excessive worry,  difficulty relaxing, difficulty delegating responsibility, need for "perfection," distress when plans are disrupted (Status: maintained). Depression: excessive fatigue, depressed mood, disruptions to social relationships (Status: maintained).  Problems Addressed  New Description, New Description  Goals 1. Marisa Gregory is experiencing symptoms of anxiety, most notably when activities don't go according to plan or as efficiently as she would like Objective Marisa Gregory will develop strategies to manage her anxiety and improve her overall mood  Target Date: 2023-05-23 Frequency: Monthly  Progress: 60 Modality: individual  Related Interventions Therapist will provide Marisa Gregory with an opportunity to process her experiences in session Therapist will help Marisa Gregory to identify and disengage from negative thought patterns using CBT-based strategies Therapist will provide Marisa Gregory with strategies to regulate her emotions, including deep breathing, meditation, mindfulness, and self-care Therapist will engage Marisa Gregory in gradual exposure exercises as appropriate Therapist will provide Marisa Gregory with additional resources as appropriate 2. Marisa Gregory reported that she has begun experiencing some symptoms of depression related to her anxiety Diagnosis Axis none 300.00 (Anxiety state, unspecified) - Open - [Signifier: n/a]    Conditions For Discharge Achievement of treatment goals and objectives     Chrissie Noa, PhD               Chrissie Noa, PhD

## 2023-04-15 ENCOUNTER — Ambulatory Visit: Payer: 59 | Admitting: Clinical

## 2023-04-15 DIAGNOSIS — F411 Generalized anxiety disorder: Secondary | ICD-10-CM | POA: Diagnosis not present

## 2023-04-15 NOTE — Progress Notes (Signed)
Diagnosis: F41.1, Generalized Anxiety Disorder Time of session: 10:01 am-10:57 am CPT code: [id number]  Delight was seen remotely using secure video conferencing. She was in her apartment off campus at [facility], and therapist was in her at office the time of the appointment. Client is aware of risks of telehealth and consented to a virtual visit. Tarshia joined session 15 minutes late due to a scheduling conflict, but therapist was present at start of session time and communicated with client via secure message about her appointment. Session focused on Farris's finals, and especially how to manage feelings of burnout with her final push to study. Therapist suggested strategies to minimize distraction and establish structure. Mimmie is scheduled to be seen again in two weeks.   Objectives Related Problem: Vylet is experiencing symptoms of anxiety, most notably when activities don't go according to plan or as efficiently as she would like Description: Fleeta Emmer will develop strategies to manage her anxiety and improve her overall mood Target Date: 2023-05-23 Frequency: Monthly Modality: individual Progress: 60%  Planned Intervention: Therapist will help Allis to identify and disengage from negative thought patterns using CBT-based strategies  Planned Intervention: Therapist will provide Chandler with an opportunity to process her experiences in session  Planned Intervention: Therapist will provide Ellese with strategies to regulate her emotions, including deep breathing, meditation, mindfulness, and self-care  Client Response  Treatment Plan Client Abilities/Strengths  Client presented a motivated, intelligent, and insightful.  Client Treatment Preferences  Client prefers not to miss classes.  Client Statement of Needs  Novel is seeking CBT to help her manage symptoms of anxiety and related depression.  Treatment Level  Monthly  Symptoms  Anxiety: excessive worry, difficulty relaxing,  difficulty delegating responsibility, need for "perfection," distress when plans are disrupted (Status: maintained). Depression: excessive fatigue, depressed mood, disruptions to social relationships (Status: maintained).  Problems Addressed  New Description, New Description  Goals 1. Malessa is experiencing symptoms of anxiety, most notably when activities don't go according to plan or as efficiently as she would like Objective Fleeta Emmer will develop strategies to manage her anxiety and improve her overall mood  Target Date: 2023-05-23 Frequency: Monthly  Progress: 60 Modality: individual  Related Interventions Therapist will provide Adhira with an opportunity to process her experiences in session Therapist will help Shalina to identify and disengage from negative thought patterns using CBT-based strategies Therapist will provide Sumaiyah with strategies to regulate her emotions, including deep breathing, meditation, mindfulness, and self-care Therapist will engage Tamee in gradual exposure exercises as appropriate Therapist will provide Lateia with additional resources as appropriate 2. Zettie reported that she has begun experiencing some symptoms of depression related to her anxiety Diagnosis Axis none 300.00 (Anxiety state, unspecified) - Open - [Signifier: n/a]    Conditions For Discharge Achievement of treatment goals and objectives     Chrissie Noa, PhD               Chrissie Noa, PhD

## 2023-04-28 ENCOUNTER — Ambulatory Visit: Payer: 59 | Admitting: Clinical

## 2023-04-28 DIAGNOSIS — F411 Generalized anxiety disorder: Secondary | ICD-10-CM

## 2023-04-28 NOTE — Progress Notes (Signed)
Diagnosis: F41.1, Generalized Anxiety Disorder Time of session: 12:01 pm-12:57 pm CPT code: 16109U-04  Marisa Gregory was seen remotely using secure video conferencing. She was in her apartment off campus at [facility], and therapist was in her at office the time of the appointment. Marisa Gregory is aware of risks of telehealth and consented to a virtual visit. Marisa Gregory reported a medication change from abilify to prozac, and expressed optimism that she will start noticing a change in the coming weeks. She shared that she had opted for the change after noticing blunted emotional range on abilify. She reported already experiencing anxiety related to her upcoming job search next semester. She has resumed an exercise routine, and is looking forward to a birthday trip with her friends. Therapist encouraged her to use her increased freedom over the break to re-incorporate pleasant events into her routine. She is scheduled to be seen again in three weeks.   Objectives Related Problem: Marisa Gregory is experiencing symptoms of anxiety, most notably when activities don't go according to plan or as efficiently as she would like Description: Marisa Gregory will develop strategies to manage her anxiety and improve her overall mood Target Date: 2023-05-23 Frequency: Monthly Modality: individual Progress: 60%  Planned Intervention: Therapist will help Marisa Gregory to identify and disengage from negative thought patterns using CBT-based strategies  Planned Intervention: Therapist will provide Marisa Gregory with an opportunity to process her experiences in session  Planned Intervention: Therapist will provide Marisa Gregory with strategies to regulate her emotions, including deep breathing, meditation, mindfulness, and self-care  Marisa Gregory Response  Treatment Plan Marisa Gregory Abilities/Strengths  Marisa Gregory presented a motivated, intelligent, and insightful.  Marisa Gregory Treatment Preferences  Marisa Gregory prefers not to miss classes.  Marisa Gregory Statement of Needs  Marisa Gregory is  seeking CBT to help her manage symptoms of anxiety and related depression.  Treatment Level  Monthly  Symptoms  Anxiety: excessive worry, difficulty relaxing, difficulty delegating responsibility, need for "perfection," distress when plans are disrupted (Status: maintained). Depression: excessive fatigue, depressed mood, disruptions to social relationships (Status: maintained).  Problems Addressed  New Description, New Description  Goals 1. Marisa Gregory is experiencing symptoms of anxiety, most notably when activities don't go according to plan or as efficiently as she would like Objective Marisa Gregory will develop strategies to manage her anxiety and improve her overall mood  Target Date: 2023-05-23 Frequency: Monthly  Progress: 60 Modality: individual  Related Interventions Therapist will provide Marisa Gregory with an opportunity to process her experiences in session Therapist will help Marisa Gregory to identify and disengage from negative thought patterns using CBT-based strategies Therapist will provide Marisa Gregory with strategies to regulate her emotions, including deep breathing, meditation, mindfulness, and self-care Therapist will engage Marisa Gregory in gradual exposure exercises as appropriate Therapist will provide Marisa Gregory with additional resources as appropriate 2. Marisa Gregory reported that she has begun experiencing some symptoms of depression related to her anxiety Diagnosis Axis none 300.00 (Anxiety state, unspecified) - Open - [Signifier: n/a]    Conditions For Discharge Achievement of treatment goals and objectives            Marisa Noa, PhD               Marisa Noa, PhD

## 2023-05-21 ENCOUNTER — Ambulatory Visit: Payer: 59 | Admitting: Clinical

## 2023-05-21 DIAGNOSIS — F411 Generalized anxiety disorder: Secondary | ICD-10-CM

## 2023-05-21 NOTE — Progress Notes (Signed)
 Diagnosis: F41.1, Generalized Anxiety Disorder Time of session: 10:01 am-10:57 am CPT code: 09162E-04  Marisa Gregory was seen remotely using secure video conferencing. She was in her apartment off campus at [facility], and therapist was in her at office the time of the appointment. Client is aware of risks of telehealth and consented to a virtual visit. Session focused on dynamics in her friend group that had proven stressful in recent weeks. Therapist suggested communication strategies and alternate interpretations of several scenarios. She is scheduled to be seen again in one month.   Objectives Related Problem: [name] is experiencing symptoms of anxiety, most notably when activities don't go according to plan or as efficiently as she would like Description: [name] will develop strategies to manage her anxiety and improve her overall mood Target Date: 2023-05-23 Frequency: Monthly Modality: individual Progress: 60%  Planned Intervention: Therapist will help [name] to identify and disengage from negative thought patterns using CBT-based strategies  Planned Intervention: Therapist will provide [name] with an opportunity to process her experiences in session  Planned Intervention: Therapist will provide [name] with strategies to regulate her emotions, including deep breathing, meditation, mindfulness, and self-care  Client Response  Treatment Plan Client Abilities/Strengths  Client presented a motivated, intelligent, and insightful.  Client Treatment Preferences  Client prefers not to miss classes.  Client Statement of Needs  [name] is seeking CBT to help her manage symptoms of anxiety and related depression.  Treatment Level  Monthly  Symptoms  Anxiety: excessive worry, difficulty relaxing, difficulty delegating responsibility, need for perfection, distress when plans are disrupted (Status: maintained). Depression: excessive fatigue, depressed mood, disruptions to social relationships  (Status: maintained).  Problems Addressed  New Description, New Description  Goals 1. [name] is experiencing symptoms of anxiety, most notably when activities don't go according to plan or as efficiently as she would like Objective [name] will develop strategies to manage her anxiety and improve her overall mood  Target Date: 2023-05-23 Frequency: Monthly  Progress: 60 Modality: individual  Related Interventions Therapist will provide [name] with an opportunity to process her experiences in session Therapist will help [name] to identify and disengage from negative thought patterns using CBT-based strategies Therapist will provide [name] with strategies to regulate her emotions, including deep breathing, meditation, mindfulness, and self-care Therapist will engage [name] in gradual exposure exercises as appropriate Therapist will provide [name] with additional resources as appropriate 2. [name] reported that she has begun experiencing some symptoms of depression related to her anxiety Diagnosis Axis none 300.00 (Anxiety state, unspecified) - Open - [Signifier: n/a]    Conditions For Discharge Achievement of treatment goals and objectives                  [name], PhD               [name], PhD

## 2023-06-18 ENCOUNTER — Ambulatory Visit: Payer: 59 | Admitting: Clinical

## 2023-06-18 DIAGNOSIS — F411 Generalized anxiety disorder: Secondary | ICD-10-CM | POA: Diagnosis not present

## 2023-06-18 NOTE — Progress Notes (Addendum)
 Diagnosis: F41.1, Generalized Anxiety Disorder Time of session: 9:01 am-9:57 am CPT code: 09162E-04  Deetya was seen remotely using secure video conferencing. She was in her apartment off campus at [facility], and therapist was in her at office the time of the appointment. Client is aware of risks of telehealth and consented to a virtual visit. Zyan reported that she had experienced excessive fatigue following a medication change several weeks ago, which had prompted her to query whether she may be experiencing a depressive episode. She indicated a plan to take her medication in the evening to see if this helps. She reported feelings of anxiety related to the general world situation. Therapist offered validation and support, and engaged her in discussion of how to communicate her emotional needs to her partner. She is scheduled to be seen again in two weeks.    Objectives Related Problem: Merilyn is experiencing symptoms of anxiety, most notably when activities don't go according to plan or as efficiently as she would like Description: [name] will develop strategies to manage her anxiety and improve her overall mood Target Date: 2024-05-22 Frequency: Monthly Modality: individual Progress: 60%  Planned Intervention: Therapist will help Sharonlee to identify and disengage from negative thought patterns using CBT-based strategies  Planned Intervention: Therapist will provide Dominic with an opportunity to process her experiences in session  Planned Intervention: Therapist will provide Riata with strategies to regulate her emotions, including deep breathing, meditation, mindfulness, and self-care  Client Response  Treatment Plan Client Abilities/Strengths  Client presented a motivated, intelligent, and insightful.  Client Treatment Preferences  Client prefers not to miss classes.  Client Statement of Needs  Leeloo is seeking CBT to help her manage symptoms of anxiety and related depression.   Treatment Level  Monthly  Symptoms  Anxiety: excessive worry, difficulty relaxing, difficulty delegating responsibility, need for perfection, distress when plans are disrupted (Status: maintained). Depression: excessive fatigue, depressed mood, disruptions to social relationships (Status: maintained).  Problems Addressed  New Description, New Description  Goals 1. Shaniece is experiencing symptoms of anxiety, most notably when activities don't go according to plan or as efficiently as she would like Objective [name] will develop strategies to manage her anxiety and improve her overall mood  Target Date: 2024-05-22 Frequency: Monthly  Progress: 60 Modality: individual  Related Interventions Therapist will provide Avynn with an opportunity to process her experiences in session Therapist will help Calirose to identify and disengage from negative thought patterns using CBT-based strategies Therapist will provide Rakeb with strategies to regulate her emotions, including deep breathing, meditation, mindfulness, and self-care Therapist will engage Fedora in gradual exposure exercises as appropriate Therapist will provide Anadalay with additional resources as appropriate 2. Kathleena reported that she has begun experiencing some symptoms of depression related to her anxiety Diagnosis Axis none 300.00 (Anxiety state, unspecified) - Open - [Signifier: n/a]    Conditions For Discharge Achievement of treatment goals and objectives        [name], PhD               [name], PhD

## 2023-07-02 ENCOUNTER — Ambulatory Visit: Payer: 59 | Admitting: Clinical

## 2023-07-02 NOTE — Progress Notes (Unsigned)
   Chrissie Noa, PhD

## 2023-07-28 ENCOUNTER — Ambulatory Visit: Payer: 59 | Admitting: Clinical

## 2023-07-28 DIAGNOSIS — F411 Generalized anxiety disorder: Secondary | ICD-10-CM | POA: Diagnosis not present

## 2023-07-28 NOTE — Progress Notes (Signed)
 Diagnosis: F41.1, Generalized Anxiety Disorder Time of session: 9:01 am-9:57 am CPT code: 25956L-87  Annastacia was seen remotely using secure video conferencing. She was in her apartment off campus at [facility], and therapist was in her at office the time of the appointment. Client is aware of risks of telehealth and consented to a virtual visit. Josephene reported that she continues to struggle with motivation, which she attributed to feelings of overwhelm and anxiety. Therapist engaged her in discussion of behavior activation strategies, and she created a plan to find somewhere to go work right after class, then return home and do something nice for herself. She is scheduled to be seen again in April, and therapist will reach out as cancellations happen.   Objectives Related Problem: Cynthia is experiencing symptoms of anxiety, most notably when activities don't go according to plan or as efficiently as she would like Description: Fleeta Emmer will develop strategies to manage her anxiety and improve her overall mood Target Date: 2024-05-22 Frequency: Monthly Modality: individual Progress: 60%  Planned Intervention: Therapist will help Elayjah to identify and disengage from negative thought patterns using CBT-based strategies  Planned Intervention: Therapist will provide Lilly with an opportunity to process her experiences in session  Planned Intervention: Therapist will provide Chioma with strategies to regulate her emotions, including deep breathing, meditation, mindfulness, and self-care  Client Response  Treatment Plan Client Abilities/Strengths  Client presented a motivated, intelligent, and insightful.  Client Treatment Preferences  Client prefers not to miss classes.  Client Statement of Needs  Chaniyah is seeking CBT to help her manage symptoms of anxiety and related depression.  Treatment Level  Monthly  Symptoms  Anxiety: excessive worry, difficulty relaxing, difficulty delegating  responsibility, need for "perfection," distress when plans are disrupted (Status: maintained). Depression: excessive fatigue, depressed mood, disruptions to social relationships (Status: maintained).  Problems Addressed  New Description, New Description  Goals 1. Sruti is experiencing symptoms of anxiety, most notably when activities don't go according to plan or as efficiently as she would like Objective Fleeta Emmer will develop strategies to manage her anxiety and improve her overall mood  Target Date: 2024-05-22 Frequency: Monthly  Progress: 60 Modality: individual  Related Interventions Therapist will provide Deanna with an opportunity to process her experiences in session Therapist will help Sherlene to identify and disengage from negative thought patterns using CBT-based strategies Therapist will provide Nevaeh with strategies to regulate her emotions, including deep breathing, meditation, mindfulness, and self-care Therapist will engage Elira in gradual exposure exercises as appropriate Therapist will provide Geral with additional resources as appropriate 2. Quanna reported that she has begun experiencing some symptoms of depression related to her anxiety Diagnosis Axis none 300.00 (Anxiety state, unspecified) - Open - [Signifier: n/a]    Conditions For Discharge Achievement of treatment goals and objectives       Chrissie Noa, PhD               Chrissie Noa, PhD

## 2023-08-13 ENCOUNTER — Ambulatory Visit: Admitting: Clinical

## 2023-08-13 DIAGNOSIS — F411 Generalized anxiety disorder: Secondary | ICD-10-CM

## 2023-08-13 NOTE — Progress Notes (Signed)
 Diagnosis: F41.1, Generalized Anxiety Disorder Time of session: 1:01 pm-1:54 pm CPT code: [id number]  Chelly was seen remotely using secure video conferencing. She was in her apartment off campus at [facility], and therapist was in her at office the time of the appointment. Client is aware of risks of telehealth and consented to a virtual visit. Aryiana reported some improvement in mood since her last session, which she attributed to the start of spring and remission of seasonal depression. She expressed increased optimism about the coming year resulting from having more of a plan. However, she continues to struggle with motivation to study for an upcoming test. Therapist processed with her, working with her to create a plan. She is scheduled to be seen again in three weeks.    Objectives Related Problem: Shanikwa is experiencing symptoms of anxiety, most notably when activities don't go according to plan or as efficiently as she would like Description: Fleeta Emmer will develop strategies to manage her anxiety and improve her overall mood Target Date: 2024-05-22 Frequency: Monthly Modality: individual Progress: 60%  Planned Intervention: Therapist will help Kenslie to identify and disengage from negative thought patterns using CBT-based strategies  Planned Intervention: Therapist will provide Lashaye with an opportunity to process her experiences in session  Planned Intervention: Therapist will provide Iesha with strategies to regulate her emotions, including deep breathing, meditation, mindfulness, and self-care  Client Response  Treatment Plan Client Abilities/Strengths  Client presented a motivated, intelligent, and insightful.  Client Treatment Preferences  Client prefers not to miss classes.  Client Statement of Needs  Neriah is seeking CBT to help her manage symptoms of anxiety and related depression.  Treatment Level  Monthly  Symptoms  Anxiety: excessive worry, difficulty relaxing,  difficulty delegating responsibility, need for "perfection," distress when plans are disrupted (Status: maintained). Depression: excessive fatigue, depressed mood, disruptions to social relationships (Status: maintained).  Problems Addressed  New Description, New Description  Goals 1. Soila is experiencing symptoms of anxiety, most notably when activities don't go according to plan or as efficiently as she would like Objective Fleeta Emmer will develop strategies to manage her anxiety and improve her overall mood  Target Date: 2024-05-22 Frequency: Monthly  Progress: 60 Modality: individual  Related Interventions Therapist will provide Elouise with an opportunity to process her experiences in session Therapist will help Fronia to identify and disengage from negative thought patterns using CBT-based strategies Therapist will provide Miyo with strategies to regulate her emotions, including deep breathing, meditation, mindfulness, and self-care Therapist will engage Maleni in gradual exposure exercises as appropriate Therapist will provide Shaletha with additional resources as appropriate 2. Tula reported that she has begun experiencing some symptoms of depression related to her anxiety Diagnosis Axis none 300.00 (Anxiety state, unspecified) - Open - [Signifier: n/a]    Conditions For Discharge Achievement of treatment goals and objectives    Chrissie Noa, PhD               Chrissie Noa, PhD

## 2023-09-05 ENCOUNTER — Ambulatory Visit: Admitting: Clinical

## 2023-09-05 DIAGNOSIS — F411 Generalized anxiety disorder: Secondary | ICD-10-CM | POA: Diagnosis not present

## 2023-09-05 NOTE — Progress Notes (Signed)
 Diagnosis: F41.1, Generalized Anxiety Disorder Time of session: 1:01 pm-1:54 pm CPT code: 32440N-02  [name] was seen remotely using secure video conferencing. She was in her apartment off campus at [facility], and therapist was in her at office the time of the appointment. Client is aware of risks of telehealth and consented to a virtual visit. Marisa Gregory reported upon several positive developments since her last session. She had resumed regular exercise and has an interview lined up for a job she is excited about. She has been doing well on her finals. For homework, she will continue exercising regularly.She is scheduled to be seen again in two weeks.    Objectives Related Problem: Marisa Gregory is experiencing symptoms of anxiety, most notably when activities don't go according to plan or as efficiently as she would like Description: Marisa Gregory will develop strategies to manage her anxiety and improve her overall mood Target Date: 2024-05-22 Frequency: Monthly Modality: individual Progress: 60%  Planned Intervention: Therapist will help Marisa Gregory to identify and disengage from negative thought patterns using CBT-based strategies  Planned Intervention: Therapist will provide Marisa Gregory with an opportunity to process her experiences in session  Planned Intervention: Therapist will provide Marisa Gregory with strategies to regulate her emotions, including deep breathing, meditation, mindfulness, and self-care  Client Response  Treatment Plan Client Abilities/Strengths  Client presented a motivated, intelligent, and insightful.  Client Treatment Preferences  Client prefers not to miss classes.  Client Statement of Needs  Marisa Gregory is seeking CBT to help her manage symptoms of anxiety and related depression.  Treatment Level  Monthly  Symptoms  Anxiety: excessive worry, difficulty relaxing, difficulty delegating responsibility, need for "perfection," distress when plans are disrupted (Status: maintained). Depression:  excessive fatigue, depressed mood, disruptions to social relationships (Status: maintained).  Problems Addressed  New Description, New Description  Goals 1. Marisa Gregory is experiencing symptoms of anxiety, most notably when activities don't go according to plan or as efficiently as she would like Objective Marisa Gregory will develop strategies to manage her anxiety and improve her overall mood  Target Date: 2024-05-22 Frequency: Monthly  Progress: 60 Modality: individual  Related Interventions Therapist will provide Marisa Gregory with an opportunity to process her experiences in session Therapist will help Marisa Gregory to identify and disengage from negative thought patterns using CBT-based strategies Therapist will provide Marisa Gregory with strategies to regulate her emotions, including deep breathing, meditation, mindfulness, and self-care Therapist will engage Marisa Gregory in gradual exposure exercises as appropriate Therapist will provide Marisa Gregory with additional resources as appropriate 2. Marisa Gregory reported that she has begun experiencing some symptoms of depression related to her anxiety Diagnosis Axis none 300.00 (Anxiety state, unspecified) - Open - [Signifier: n/a]    Conditions For Discharge Achievement of treatment goals and objectives   Arlene Lacy, PhD               Arlene Lacy, PhD

## 2023-09-18 ENCOUNTER — Ambulatory Visit: Admitting: Clinical

## 2023-09-18 DIAGNOSIS — F411 Generalized anxiety disorder: Secondary | ICD-10-CM | POA: Diagnosis not present

## 2023-09-18 NOTE — Progress Notes (Signed)
 Diagnosis: F41.1, Generalized Anxiety Disorder Time of session: 1:05 pm-1:59 pm CPT code: [id number]  Marisa Gregory was seen remotely using secure video conferencing. She was in her apartment off campus at [facility], and therapist was in her at office the time of the appointment. Client is aware of risks of telehealth and consented to a virtual visit. Marisa Gregory reported that she had completed finals, earning straight As, and was looking forward to graduation the following day. Session focused on stressors that had arisen in a close friendship. Therapist offered an opportunity to process, suggesting strategies and reframing several negative cognitions. She is scheduled to be seen again in one month.   Objectives Related Problem: Marisa Gregory is experiencing symptoms of anxiety, most notably when activities don't go according to plan or as efficiently as she would like Description: Marisa Gregory will develop strategies to manage her anxiety and improve her overall mood Target Date: 2024-05-22 Frequency: Monthly Modality: individual Progress: 60%  Planned Intervention: Therapist will help Marisa Gregory to identify and disengage from negative thought patterns using CBT-based strategies  Planned Intervention: Therapist will provide Marisa Gregory with an opportunity to process her experiences in session  Planned Intervention: Therapist will provide Marisa Gregory with strategies to regulate her emotions, including deep breathing, meditation, mindfulness, and self-care  Client Response  Treatment Plan Client Abilities/Strengths  Client presented a motivated, intelligent, and insightful.  Client Treatment Preferences  Client prefers not to miss classes.  Client Statement of Needs  Marisa Gregory is seeking CBT to help her manage symptoms of anxiety and related depression.  Treatment Level  Monthly  Symptoms  Anxiety: excessive worry, difficulty relaxing, difficulty delegating responsibility, need for "perfection," distress when plans are  disrupted (Status: maintained). Depression: excessive fatigue, depressed mood, disruptions to social relationships (Status: maintained).  Problems Addressed  New Description, New Description  Goals 1. Marisa Gregory is experiencing symptoms of anxiety, most notably when activities don't go according to plan or as efficiently as she would like Objective Marisa Gregory will develop strategies to manage her anxiety and improve her overall mood  Target Date: 2024-05-22 Frequency: Monthly  Progress: 60 Modality: individual  Related Interventions Therapist will provide Marisa Gregory with an opportunity to process her experiences in session Therapist will help Marisa Gregory to identify and disengage from negative thought patterns using CBT-based strategies Therapist will provide Marisa Gregory with strategies to regulate her emotions, including deep breathing, meditation, mindfulness, and self-care Therapist will engage Marisa Gregory in gradual exposure exercises as appropriate Therapist will provide Marisa Gregory with additional resources as appropriate 2. Marisa Gregory reported that she has begun experiencing some symptoms of depression related to her anxiety Diagnosis Axis none 300.00 (Anxiety state, unspecified) - Open - [Signifier: n/a]    Conditions For Discharge Achievement of treatment goals and objectives    Marisa Lacy, PhD               Marisa Lacy, PhD

## 2023-09-25 ENCOUNTER — Ambulatory Visit: Admitting: Clinical

## 2023-10-17 ENCOUNTER — Ambulatory Visit: Admitting: Clinical

## 2023-10-17 DIAGNOSIS — F411 Generalized anxiety disorder: Secondary | ICD-10-CM | POA: Diagnosis not present

## 2023-10-17 NOTE — Progress Notes (Signed)
 Diagnosis: F41.1, Generalized Anxiety Disorder Time of session: 10:01 am-10:55 am CPT code: 16109U-04  Marisa Gregory was seen remotely using secure video conferencing. She was in her apartment off campus at [facility], and therapist was in her at office the time of the appointment. Client is aware of risks of telehealth and consented to a virtual visit. Session focused on challenges that had arisen in her relationship, to a degree that Marisa Gregory is considering ending the relationship. Therapist processed this with her, suggesting communication strategies and offering validation and support. She is scheduled to be seen again on Tuesday.   Objectives Related Problem: Marisa Gregory is experiencing symptoms of anxiety, most notably when activities don't go according to plan or as efficiently as she would like Description: Marisa Gregory will develop strategies to manage her anxiety and improve her overall mood Target Date: 2024-05-22 Frequency: Monthly Modality: individual Progress: 60%  Planned Intervention: Therapist will help Marisa Gregory to identify and disengage from negative thought patterns using CBT-based strategies  Planned Intervention: Therapist will provide Marisa Gregory with an opportunity to process her experiences in session  Planned Intervention: Therapist will provide Marisa Gregory with strategies to regulate her emotions, including deep breathing, meditation, mindfulness, and self-care  Client Response  Treatment Plan Client Abilities/Strengths  Client presented a motivated, intelligent, and insightful.  Client Treatment Preferences  Client prefers not to miss classes.  Client Statement of Needs  Marisa Gregory is seeking CBT to help her manage symptoms of anxiety and related depression.  Treatment Level  Monthly  Symptoms  Anxiety: excessive worry, difficulty relaxing, difficulty delegating responsibility, need for "perfection," distress when plans are disrupted (Status: maintained). Depression: excessive fatigue,  depressed mood, disruptions to social relationships (Status: maintained).  Problems Addressed  New Description, New Description  Goals 1. Marisa Gregory is experiencing symptoms of anxiety, most notably when activities don't go according to plan or as efficiently as she would like Objective Marisa Gregory will develop strategies to manage her anxiety and improve her overall mood  Target Date: 2024-05-22 Frequency: Monthly  Progress: 60 Modality: individual  Related Interventions Therapist will provide Marisa Gregory with an opportunity to process her experiences in session Therapist will help Marisa Gregory to identify and disengage from negative thought patterns using CBT-based strategies Therapist will provide Marisa Gregory with strategies to regulate her emotions, including deep breathing, meditation, mindfulness, and self-care Therapist will engage Marisa Gregory in gradual exposure exercises as appropriate Therapist will provide Marisa Gregory with additional resources as appropriate 2. Marisa Gregory reported that she has begun experiencing some symptoms of depression related to her anxiety Diagnosis Axis none 300.00 (Anxiety state, unspecified) - Open - [Signifier: n/a]    Conditions For Discharge Achievement of treatment goals and objectives     Marisa Lacy, PhD               Marisa Lacy, PhD

## 2023-10-21 ENCOUNTER — Ambulatory Visit (INDEPENDENT_AMBULATORY_CARE_PROVIDER_SITE_OTHER): Admitting: Clinical

## 2023-10-21 DIAGNOSIS — F411 Generalized anxiety disorder: Secondary | ICD-10-CM | POA: Diagnosis not present

## 2023-10-21 NOTE — Progress Notes (Signed)
 Diagnosis: F41.1, Generalized Anxiety Disorder Time of session: 2:01 pm-2:56 pm CPT code: 16109U-04  Marisa Gregory was seen remotely using secure video conferencing. She was in her apartment off campus at [facility], and therapist was in her at office the time of the appointment. Client is aware of risks of telehealth and consented to a virtual visit. Session focused on processing a conversation she had had with her boyfriend. She shared increased optimism about the relationship, along with concerns about his willingness and ability to work on fully hearing and respecting her boundaries. Therapist offered validation and support, and suggested communication strategies. She is scheduled to be seen again in two weeks.   Objectives Related Problem: Marisa Gregory is experiencing symptoms of anxiety, most notably when activities don't go according to plan or as efficiently as she would like Description: Marisa Gregory will develop strategies to manage her anxiety and improve her overall mood Target Date: 2024-05-22 Frequency: Monthly Modality: individual Progress: 60%  Planned Intervention: Therapist will help Marisa Gregory to identify and disengage from negative thought patterns using CBT-based strategies  Planned Intervention: Therapist will provide Marisa Gregory with an opportunity to process her experiences in session  Planned Intervention: Therapist will provide Marisa Gregory with strategies to regulate her emotions, including deep breathing, meditation, mindfulness, and self-care  Client Response  Treatment Plan Client Abilities/Strengths  Client presented a motivated, intelligent, and insightful.  Client Treatment Preferences  Client prefers not to miss classes.  Client Statement of Needs  Marisa Gregory is seeking CBT to help her manage symptoms of anxiety and related depression.  Treatment Level  Monthly  Symptoms  Anxiety: excessive worry, difficulty relaxing, difficulty delegating responsibility, need for "perfection,"  distress when plans are disrupted (Status: maintained). Depression: excessive fatigue, depressed mood, disruptions to social relationships (Status: maintained).  Problems Addressed  New Description, New Description  Goals 1. Andilynn is experiencing symptoms of anxiety, most notably when activities don't go according to plan or as efficiently as she would like Objective Marisa Gregory will develop strategies to manage her anxiety and improve her overall mood  Target Date: 2024-05-22 Frequency: Monthly  Progress: 60 Modality: individual  Related Interventions Therapist will provide Marisa Gregory with an opportunity to process her experiences in session Therapist will help Marisa Gregory to identify and disengage from negative thought patterns using CBT-based strategies Therapist will provide Marisa Gregory with strategies to regulate her emotions, including deep breathing, meditation, mindfulness, and self-care Therapist will engage Marisa Gregory in gradual exposure exercises as appropriate Therapist will provide Marisa Gregory with additional resources as appropriate 2. Marisa Gregory reported that she has begun experiencing some symptoms of depression related to her anxiety Diagnosis Axis none 300.00 (Anxiety state, unspecified) - Open - [Signifier: n/a]    Conditions For Discharge Achievement of treatment goals and objectives   Marisa Lacy, PhD               Marisa Lacy, PhD

## 2023-11-07 ENCOUNTER — Ambulatory Visit (INDEPENDENT_AMBULATORY_CARE_PROVIDER_SITE_OTHER): Admitting: Clinical

## 2023-11-07 DIAGNOSIS — F411 Generalized anxiety disorder: Secondary | ICD-10-CM | POA: Diagnosis not present

## 2023-11-07 NOTE — Progress Notes (Signed)
 Diagnosis: F41.1, Generalized Anxiety Disorder Time of session: 2:01 pm-2:56 pm CPT code: [id number]-04  Lindee was seen remotely using secure video conferencing. She was in her apartment in [name], and therapist was in her at office the time of the appointment. Client is aware of risks of telehealth and consented to a virtual visit. She reported that she and her boyfriend had broken up. Session focused on processing and considering coping strategies. Suicidal ideation and self harm ideation were denied. She is scheduled to be seen again in two weeks.   Objectives Related Problem: Sharlotte is experiencing symptoms of anxiety, most notably when activities don't go according to plan or as efficiently as she would like Description: [name] will develop strategies to manage her anxiety and improve her overall mood Target Date: 2024-05-22 Frequency: Monthly Modality: individual Progress: 60%  Planned Intervention: Therapist will help Karon to identify and disengage from negative thought patterns using CBT-based strategies  Planned Intervention: Therapist will provide Tajana with an opportunity to process her experiences in session  Planned Intervention: Therapist will provide Amily with strategies to regulate her emotions, including deep breathing, meditation, mindfulness, and self-care  Client Response  Treatment Plan Client Abilities/Strengths  Client presented a motivated, intelligent, and insightful.  Client Treatment Preferences  Client prefers not to miss classes.  Client Statement of Needs  Zaina is seeking CBT to help her manage symptoms of anxiety and related depression.  Treatment Level  Monthly  Symptoms  Anxiety: excessive worry, difficulty relaxing, difficulty delegating responsibility, need for perfection, distress when plans are disrupted (Status: maintained). Depression: excessive fatigue, depressed mood, disruptions to social relationships (Status: maintained).   Problems Addressed  New Description, New Description  Goals 1. Alexandr is experiencing symptoms of anxiety, most notably when activities don't go according to plan or as efficiently as she would like Objective [name] will develop strategies to manage her anxiety and improve her overall mood  Target Date: 2024-05-22 Frequency: Monthly  Progress: 60 Modality: individual  Related Interventions Therapist will provide Joley with an opportunity to process her experiences in session Therapist will help Jewel to identify and disengage from negative thought patterns using CBT-based strategies Therapist will provide Marijah with strategies to regulate her emotions, including deep breathing, meditation, mindfulness, and self-care Therapist will engage Laury in gradual exposure exercises as appropriate Therapist will provide Shatisha with additional resources as appropriate 2. Delois reported that she has begun experiencing some symptoms of depression related to her anxiety Diagnosis Axis none 300.00 (Anxiety state, unspecified) - Open - [Signifier: n/a]    Conditions For Discharge Achievement of treatment goals and objectives    [name], PhD               [name], PhD

## 2023-11-17 ENCOUNTER — Ambulatory Visit (INDEPENDENT_AMBULATORY_CARE_PROVIDER_SITE_OTHER): Admitting: Clinical

## 2023-11-17 DIAGNOSIS — F411 Generalized anxiety disorder: Secondary | ICD-10-CM

## 2023-11-17 NOTE — Progress Notes (Signed)
 Diagnosis: F41.1, Generalized Anxiety Disorder Time of session: 4:01 pm-4:56 pm CPT code: 09162E-04  Marisa Gregory was seen remotely using secure video conferencing. She was in her apartment in [facility], and therapist was in her at office the time of the appointment. Client is aware of risks of telehealth and consented to a virtual visit. Session focused on developments in her friend group. Therapist offered validation and support, and engaged Marisa Gregory in consideration of alternate perspectives. She is scheduled to be seen again in three weeks.   Objectives Related Problem: Marisa Gregory is experiencing symptoms of anxiety, most notably when activities don't go according to plan or as efficiently as she would like Description: Marisa Gregory will develop strategies to manage her anxiety and improve her overall mood Target Date: 2024-05-22 Frequency: Monthly Modality: individual Progress: 60%  Planned Intervention: Therapist will help Marisa Gregory to identify and disengage from negative thought patterns using CBT-based strategies  Planned Intervention: Therapist will provide Marisa Gregory with an opportunity to process her experiences in session  Planned Intervention: Therapist will provide Marisa Gregory with strategies to regulate her emotions, including deep breathing, meditation, mindfulness, and self-care  Client Response  Treatment Plan Client Abilities/Strengths  Client presented a motivated, intelligent, and insightful.  Client Treatment Preferences  Client prefers not to miss classes.  Client Statement of Needs  Marisa Gregory is seeking CBT to help her manage symptoms of anxiety and related depression.  Treatment Level  Monthly  Symptoms  Anxiety: excessive worry, difficulty relaxing, difficulty delegating responsibility, need for perfection, distress when plans are disrupted (Status: maintained). Depression: excessive fatigue, depressed mood, disruptions to social relationships (Status: maintained).  Problems Addressed   New Description, New Description  Goals 1. Marisa Gregory is experiencing symptoms of anxiety, most notably when activities don't go according to plan or as efficiently as she would like Objective Marisa Gregory will develop strategies to manage her anxiety and improve her overall mood  Target Date: 2024-05-22 Frequency: Monthly  Progress: 60 Modality: individual  Related Interventions Therapist will provide [name] with an opportunity to process her experiences in session Therapist will help Marisa Gregory to identify and disengage from negative thought patterns using CBT-based strategies Therapist will provide Marisa Gregory with strategies to regulate her emotions, including deep breathing, meditation, mindfulness, and self-care Therapist will engage Marisa Gregory in gradual exposure exercises as appropriate Therapist will provide Marisa Gregory with additional resources as appropriate 2. Marisa Gregory reported that she has begun experiencing some symptoms of depression related to her anxiety Diagnosis Axis none 300.00 (Anxiety state, unspecified) - Open - [Signifier: n/a]    Conditions For Discharge Achievement of treatment goals and objectives      Marisa Gregory LITTIE Ponto, PhD               Marisa Gregory LITTIE Ponto, PhD

## 2023-11-28 ENCOUNTER — Ambulatory Visit: Admitting: Clinical

## 2023-12-08 ENCOUNTER — Ambulatory Visit (INDEPENDENT_AMBULATORY_CARE_PROVIDER_SITE_OTHER): Admitting: Clinical

## 2023-12-08 DIAGNOSIS — F411 Generalized anxiety disorder: Secondary | ICD-10-CM | POA: Diagnosis not present

## 2023-12-08 NOTE — Progress Notes (Signed)
  Diagnosis: F41.1, Generalized Anxiety Disorder Time of session: 4:02 pm-4:56 pm CPT code: 09162E-04  Teshara was seen remotely using secure video conferencing. She was in her apartment in [place] [facility] [place], and therapist was in her at office the time of the appointment. Client is aware of risks of telehealth and consented to a virtual visit. Session focused on her adjustment to post-college life, as well as developments in her relationships. She reported satisfaction with the contact she has maintained with her college friends, as well as a sense of security in her current job. Her ex-boyfriend had reached out to talk, and Cena had successfully upheld a boundary. She is scheduled to be seen again in three weeks.   Objectives Related Problem: Adra is experiencing symptoms of anxiety, most notably when activities don't go according to plan or as efficiently as she would like Description: [name] will develop strategies to manage her anxiety and improve her overall mood Target Date: 2024-05-22 Frequency: Monthly Modality: individual Progress: 60%  Planned Intervention: Therapist will help Kmya to identify and disengage from negative thought patterns using CBT-based strategies  Planned Intervention: Therapist will provide Laquanta with an opportunity to process her experiences in session  Planned Intervention: Therapist will provide Kyann with strategies to regulate her emotions, including deep breathing, meditation, mindfulness, and self-care  Client Response  Treatment Plan Client Abilities/Strengths  Client presented a motivated, intelligent, and insightful.  Client Treatment Preferences  Client prefers not to miss classes.  Client Statement of Needs  Brycelynn is seeking CBT to help her manage symptoms of anxiety and related depression.  Treatment Level  Monthly  Symptoms  Anxiety: excessive worry, difficulty relaxing, difficulty delegating responsibility, need for perfection,  distress when plans are disrupted (Status: maintained). Depression: excessive fatigue, depressed mood, disruptions to social relationships (Status: maintained).  Problems Addressed  New Description, New Description  Goals 1. Donasia is experiencing symptoms of anxiety, most notably when activities don't go according to plan or as efficiently as she would like Objective [name] will develop strategies to manage her anxiety and improve her overall mood  Target Date: 2024-05-22 Frequency: Monthly  Progress: 60 Modality: individual  Related Interventions Therapist will provide Willye with an opportunity to process her experiences in session Therapist will help Judit to identify and disengage from negative thought patterns using CBT-based strategies Therapist will provide Aviya with strategies to regulate her emotions, including deep breathing, meditation, mindfulness, and self-care Therapist will engage Ala in gradual exposure exercises as appropriate Therapist will provide Robertine with additional resources as appropriate 2. Keitha reported that she has begun experiencing some symptoms of depression related to her anxiety Diagnosis Axis none 300.00 (Anxiety state, unspecified) - Open - [Signifier: n/a]    Conditions For Discharge Achievement of treatment goals and objectives       [name], PhD

## 2024-01-02 ENCOUNTER — Ambulatory Visit: Admitting: Clinical

## 2024-01-02 DIAGNOSIS — F411 Generalized anxiety disorder: Secondary | ICD-10-CM | POA: Diagnosis not present

## 2024-01-02 NOTE — Progress Notes (Signed)
  Diagnosis: F41.1, Generalized Anxiety Disorder Time of session: 11:02 am-11:58 am CPT code: 09162E-04  Marisa Gregory was seen remotely using secure video conferencing. She was in her apartment in [facility], and therapist was in her at office the time of the appointment. Client is aware of risks of telehealth and consented to a virtual visit. She reported upon changes at work, as well as developments in her friendships. Therapist offered an opportunity to process, suggesting alternate perspectives and communication strategies. She is scheduled to be seen again in three weeks.  Objectives Related Problem: Marisa Gregory is experiencing symptoms of anxiety, most notably when activities don't go according to plan or as efficiently as she would like Description: Marisa Gregory will develop strategies to manage her anxiety and improve her overall mood Target Date: 2024-05-22 Frequency: Monthly Modality: individual Progress: 60%  Planned Intervention: Therapist will help Marisa Gregory to identify and disengage from negative thought patterns using CBT-based strategies  Planned Intervention: Therapist will provide Marisa Gregory with an opportunity to process her experiences in session  Planned Intervention: Therapist will provide Marisa Gregory with strategies to regulate her emotions, including deep breathing, meditation, mindfulness, and self-care  Client Response  Treatment Plan Client Abilities/Strengths  Client presented a motivated, intelligent, and insightful.  Client Treatment Preferences  Client prefers not to miss classes.  Client Statement of Needs  Marisa Gregory is seeking CBT to help her manage symptoms of anxiety and related depression.  Treatment Level  Monthly  Symptoms  Anxiety: excessive worry, difficulty relaxing, difficulty delegating responsibility, need for perfection, distress when plans are disrupted (Status: maintained). Depression: excessive fatigue, depressed mood, disruptions to social relationships (Status:  maintained).  Problems Addressed  New Description, New Description  Goals 1. Marisa Gregory is experiencing symptoms of anxiety, most notably when activities don't go according to plan or as efficiently as she would like Objective Marisa Gregory will develop strategies to manage her anxiety and improve her overall mood  Target Date: 2024-05-22 Frequency: Monthly  Progress: 60 Modality: individual  Related Interventions Therapist will provide Marisa Gregory with an opportunity to process her experiences in session Therapist will help Marisa Gregory to identify and disengage from negative thought patterns using CBT-based strategies Therapist will provide Marisa Gregory with strategies to regulate her emotions, including deep breathing, meditation, mindfulness, and self-care Therapist will engage Marisa Gregory in gradual exposure exercises as appropriate Therapist will provide Marisa Gregory with additional resources as appropriate 2. Marisa Gregory reported that she has begun experiencing some symptoms of depression related to her anxiety Diagnosis Axis none 300.00 (Anxiety state, unspecified) - Open - [Signifier: n/a]    Conditions For Discharge Achievement of treatment goals and objectives       Marisa LITTIE Ponto, PhD               Marisa LITTIE Ponto, PhD

## 2024-02-06 ENCOUNTER — Ambulatory Visit: Admitting: Clinical

## 2024-02-06 DIAGNOSIS — F411 Generalized anxiety disorder: Secondary | ICD-10-CM | POA: Diagnosis not present

## 2024-02-06 NOTE — Progress Notes (Signed)
 Diagnosis: F41.1, Generalized Anxiety Disorder Time of session: 11:02 am-11:58 am CPT code: 09162E-04  Everley was seen remotely using secure video conferencing. She was in her apartment in [facility], and therapist was in her at office the time of the appointment. Client is aware of risks of telehealth and consented to a virtual visit. She reported upon doing well overall, and processed several changes in her relationships. Session focused on Stephannie's querying whether she may meet criteria for autism spectrum disorder. Therapist offered psychoeducation on ASD and the evaluation process. Feleshia requested to initiate the evaluation process, and therapist offered several referral options. She is scheduled to be seen again in one month.  Objectives Related Problem: Raea is experiencing symptoms of anxiety, most notably when activities don't go according to plan or as efficiently as she would like Description: [name] will develop strategies to manage her anxiety and improve her overall mood Target Date: 2024-05-22 Frequency: Monthly Modality: individual Progress: 60%  Planned Intervention: Therapist will help Troy to identify and disengage from negative thought patterns using CBT-based strategies  Planned Intervention: Therapist will provide Cornell with an opportunity to process her experiences in session  Planned Intervention: Therapist will provide Anjela with strategies to regulate her emotions, including deep breathing, meditation, mindfulness, and self-care  Client Response  Treatment Plan Client Abilities/Strengths  Client presented a motivated, intelligent, and insightful.  Client Treatment Preferences  Client prefers not to miss classes.  Client Statement of Needs  Maragret is seeking CBT to help her manage symptoms of anxiety and related depression.  Treatment Level  Monthly  Symptoms  Anxiety: excessive worry, difficulty relaxing, difficulty delegating responsibility, need  for perfection, distress when plans are disrupted (Status: maintained). Depression: excessive fatigue, depressed mood, disruptions to social relationships (Status: maintained).  Problems Addressed  New Description, New Description  Goals 1. Zeola is experiencing symptoms of anxiety, most notably when activities don't go according to plan or as efficiently as she would like Objective [name] will develop strategies to manage her anxiety and improve her overall mood  Target Date: 2024-05-22 Frequency: Monthly  Progress: 60 Modality: individual  Related Interventions Therapist will provide Sheli with an opportunity to process her experiences in session Therapist will help Joany to identify and disengage from negative thought patterns using CBT-based strategies Therapist will provide Luisana with strategies to regulate her emotions, including deep breathing, meditation, mindfulness, and self-care Therapist will engage Mickelle in gradual exposure exercises as appropriate Therapist will provide Glynda with additional resources as appropriate 2. Jamecia reported that she has begun experiencing some symptoms of depression related to her anxiety Diagnosis Axis none 300.00 (Anxiety state, unspecified) - Open - [Signifier: n/a]    Conditions For Discharge Achievement of treatment goals and objectives     [name], PhD               [name], PhD

## 2024-03-05 ENCOUNTER — Ambulatory Visit (INDEPENDENT_AMBULATORY_CARE_PROVIDER_SITE_OTHER): Admitting: Clinical

## 2024-03-05 DIAGNOSIS — F411 Generalized anxiety disorder: Secondary | ICD-10-CM

## 2024-03-05 NOTE — Progress Notes (Signed)
 Diagnosis: F41.1, Generalized Anxiety Disorder Time of session: 10:02 am-10:57 am CPT code: 09162E-04  Anesia was seen remotely using secure video conferencing. She was in her apartment in [facility], and therapist was in her at office the time of the appointment. Client is aware of risks of telehealth and consented to a virtual visit. She reported upon doing well overall, but shared she has begun considering changing jobs. Session included processing her experience at her job so far, considering what she's learned and how that might inform what she looks for next. She is scheduled to be seen again in one month.  Objectives Related Problem: Ambert is experiencing symptoms of anxiety, most notably when activities don't go according to plan or as efficiently as she would like Description: [name] will develop strategies to manage her anxiety and improve her overall mood Target Date: 2024-05-22 Frequency: Monthly Modality: individual Progress: 60%  Planned Intervention: Therapist will help Mercadies to identify and disengage from negative thought patterns using CBT-based strategies  Planned Intervention: Therapist will provide Mahathi with an opportunity to process her experiences in session  Planned Intervention: Therapist will provide Allyssa with strategies to regulate her emotions, including deep breathing, meditation, mindfulness, and self-care  Client Response  Treatment Plan Client Abilities/Strengths  Client presented a motivated, intelligent, and insightful.  Client Treatment Preferences  Client prefers not to miss classes.  Client Statement of Needs  Sheilyn is seeking CBT to help her manage symptoms of anxiety and related depression.  Treatment Level  Monthly  Symptoms  Anxiety: excessive worry, difficulty relaxing, difficulty delegating responsibility, need for perfection, distress when plans are disrupted (Status: maintained). Depression: excessive fatigue, depressed mood,  disruptions to social relationships (Status: maintained).  Problems Addressed  New Description, New Description  Goals 1. Frady is experiencing symptoms of anxiety, most notably when activities don't go according to plan or as efficiently as she would like Objective [name] will develop strategies to manage her anxiety and improve her overall mood  Target Date: 2024-05-22 Frequency: Monthly  Progress: 60 Modality: individual  Related Interventions Therapist will provide [name] with an opportunity to process her experiences in session Therapist will help Michi to identify and disengage from negative thought patterns using CBT-based strategies Therapist will provide Jadelin with strategies to regulate her emotions, including deep breathing, meditation, mindfulness, and self-care Therapist will engage Emmanuela in gradual exposure exercises as appropriate Therapist will provide Shenelle with additional resources as appropriate 2. Rianna reported that she has begun experiencing some symptoms of depression related to her anxiety Diagnosis Axis none 300.00 (Anxiety state, unspecified) - Open - [Signifier: n/a]    Conditions For Discharge Achievement of treatment goals and objectives     [name], PhD               [name], PhD               [name], PhD

## 2024-04-02 ENCOUNTER — Ambulatory Visit (INDEPENDENT_AMBULATORY_CARE_PROVIDER_SITE_OTHER): Admitting: Clinical

## 2024-04-02 DIAGNOSIS — F411 Generalized anxiety disorder: Secondary | ICD-10-CM | POA: Diagnosis not present

## 2024-04-02 NOTE — Progress Notes (Signed)
 Diagnosis: F41.1, Generalized Anxiety Disorder Time of session: 10:05 am-10:50 am CPT code: 09165E-04  Marisa Gregory was seen remotely using secure video conferencing. She was in her apartment in [facility], and therapist was in her at office the time of the appointment. Client is aware of risks of telehealth and consented to a virtual visit. She reported that she has been doing well overall. Marisa Gregory indicated that she has been experiencing slight anxiety, which she attributed to the time of year and darker days. She has been making an effort to spend time outside, and this has been helpful. She is scheduled to be seen again in 6 weeks, and will reach out if she needs to be seen sooner.  Objectives Related Problem: Marisa Gregory is experiencing symptoms of anxiety, most notably when activities don't go according to plan or as efficiently as she would like Description: Marisa Gregory will develop strategies to manage her anxiety and improve her overall mood Target Date: 2024-05-22 Frequency: Monthly Modality: individual Progress: 60%  Planned Intervention: Therapist will help Marisa Gregory to identify and disengage from negative thought patterns using CBT-based strategies  Planned Intervention: Therapist will provide Marisa Gregory with an opportunity to process her experiences in session  Planned Intervention: Therapist will provide Marisa Gregory with strategies to regulate her emotions, including deep breathing, meditation, mindfulness, and self-care  Client Response  Treatment Plan Client Abilities/Strengths  Client presented a motivated, intelligent, and insightful.  Client Treatment Preferences  Client prefers not to miss classes.  Client Statement of Needs  Marisa Gregory is seeking CBT to help her manage symptoms of anxiety and related depression.  Treatment Level  Monthly  Symptoms  Anxiety: excessive worry, difficulty relaxing, difficulty delegating responsibility, need for perfection, distress when plans are disrupted  (Status: maintained). Depression: excessive fatigue, depressed mood, disruptions to social relationships (Status: maintained).  Problems Addressed  New Description, New Description  Goals 1. Marisa Gregory is experiencing symptoms of anxiety, most notably when activities don't go according to plan or as efficiently as she would like Objective Marisa Gregory will develop strategies to manage her anxiety and improve her overall mood  Target Date: 2024-05-22 Frequency: Monthly  Progress: 60 Modality: individual  Related Interventions Therapist will provide Marisa Gregory with an opportunity to process her experiences in session Therapist will help Marisa Gregory to identify and disengage from negative thought patterns using CBT-based strategies Therapist will provide Marisa Gregory with strategies to regulate her emotions, including deep breathing, meditation, mindfulness, and self-care Therapist will engage Marisa Gregory in gradual exposure exercises as appropriate Therapist will provide Marisa Gregory with additional resources as appropriate 2. Marisa Gregory reported that she has begun experiencing some symptoms of depression related to her anxiety Diagnosis Axis none 300.00 (Anxiety state, unspecified) - Open - [Signifier: n/a]    Conditions For Discharge Achievement of treatment goals and objectives     Marisa LITTIE Ponto, PhD       Marisa LITTIE Ponto, PhD               Marisa LITTIE Ponto, PhD

## 2024-04-30 ENCOUNTER — Ambulatory Visit: Admitting: Clinical

## 2024-05-28 ENCOUNTER — Ambulatory Visit: Admitting: Clinical

## 2024-05-28 DIAGNOSIS — F411 Generalized anxiety disorder: Secondary | ICD-10-CM | POA: Diagnosis not present

## 2024-05-28 NOTE — Progress Notes (Signed)
 Diagnosis: F41.1, Generalized Anxiety Disorder Time of session: 10:05 am-10:50 am CPT code: 09165E-04  Cherisa was seen remotely using secure video conferencing. She was in her apartment in [facility], and therapist was in her at office the time of the appointment. Client is aware of risks of telehealth and consented to a virtual visit. Session focused on family conflicts over the holidays. Therapist offered validation and support, suggesting alternate perspectives. Shebra reported that she is otherwise well overall. She is scheduled to be seen again in 5 weeks, and will reach out if she needs to be seen sooner.  Objectives Related Problem: Braelynne is experiencing symptoms of anxiety, most notably when activities don't go according to plan or as efficiently as she would like Description: [name] will develop strategies to manage her anxiety and improve her overall mood Target Date: 2025-05-22 Frequency: Monthly Modality: individual Progress: 60%  Planned Intervention: Therapist will help Lakera to identify and disengage from negative thought patterns using CBT-based strategies  Planned Intervention: Therapist will provide Kamy with an opportunity to process her experiences in session  Planned Intervention: Therapist will provide Johnanna with strategies to regulate her emotions, including deep breathing, meditation, mindfulness, and self-care  Client Response  Treatment Plan Client Abilities/Strengths  Client presented a motivated, intelligent, and insightful.  Client Treatment Preferences  Client prefers not to miss classes.  Client Statement of Needs  Cole is seeking CBT to help her manage symptoms of anxiety and related depression.  Treatment Level  Monthly  Symptoms  Anxiety: excessive worry, difficulty relaxing, difficulty delegating responsibility, need for perfection, distress when plans are disrupted (Status: maintained). Depression: excessive fatigue, depressed mood,  disruptions to social relationships (Status: maintained).  Problems Addressed  New Description, New Description  Goals 1. Wen is experiencing symptoms of anxiety, most notably when activities don't go according to plan or as efficiently as she would like Objective [name] will develop strategies to manage her anxiety and improve her overall mood  Target Date: 2025-05-22 Frequency: Monthly  Progress: 60 Modality: individual  Related Interventions Therapist will provide Yenty with an opportunity to process her experiences in session Therapist will help Kera to identify and disengage from negative thought patterns using CBT-based strategies Therapist will provide Otilia with strategies to regulate her emotions, including deep breathing, meditation, mindfulness, and self-care Therapist will engage Skilynn in gradual exposure exercises as appropriate Therapist will provide Poppy with additional resources as appropriate 2. Deaisha reported that she has begun experiencing some symptoms of depression related to her anxiety Diagnosis Axis none 300.00 (Anxiety state, unspecified) - Open - [Signifier: n/a]    Conditions For Discharge Achievement of treatment goals and objectives    [name], PhD

## 2024-06-01 ENCOUNTER — Ambulatory Visit: Admitting: Clinical

## 2024-06-01 DIAGNOSIS — F419 Anxiety disorder, unspecified: Secondary | ICD-10-CM

## 2024-06-01 NOTE — Progress Notes (Signed)
 [facility]al [facility] Counselor Initial Visit  Name: Casara Perrier Date: 06/01/2024 MRN: [id number] DOB: [birth year]  PCP: No primary care provider on file. Time Spent: 1:00  pm - 1:51 pm: 51 Minutes    CPT Code: 09208 Type of Service Provided Psychological Testing (Intake visit) Type of Contact virtual (via Caregility with real time audio and visual interaction)  Patient Location: at home Provider Location: office   Visit Information: Verna presented for an intake for an evaluation. Interview was conducted via telehealth and [place] verbally consented to telehealth. Patient consented to a telehealth visit and is aware of and consented to the limitations of telehealth.   Confidentiality and the limits of confidentiality were reviewed, along with practice consents.   Background information and information about concerns was gathered. Current safety concerns were reported. Some past safety concerns were reported, however. Safety resources were reviewed at the beginning of the visit and Elisabetta reported that she and her current therapist have discussed safety int he past and she knows what to do if safety concerns return.   Specifics of proposed evaluation discussed with Birdie and  Jerrika and examiner agreed to move forward with an evaluation. Please see below for additional information.   Intake for an Evaluation  Reason for Visit: Ekam was seen for an intake for an evaluation. Concerns expressed by Birdie include history of anxiety and some concerns that she may be autistic.   Relevant Background Information The following background information was obtained from an interview completed with [facility]. The accuracy of the background information is contingent upon the reliability of the responses provided.  Mental Status Exam: Appearance:  Casual     Behavior: Appropriate and Sharing  Motor: Normal  Speech/Language:  Clear and Coherent and Normal Rate  Affect: Appropriate  Mood:  normal  Thought process: normal  Thought content:   WNL  Sensory/Perceptual disturbances:   WNL  Orientation: oriented to person, place, time/date, and situation  Attention: Good  Concentration: Good  Memory: WNL  Fund of knowledge:  Good  Insight:   Good  Judgment:  Good  Impulse Control: Good   Reported Symptoms:  Hayzel reported that she just graduated from college and throughout college people either said that she was autistic and/or asked if she was. She talked about this with her parents, who reportedly indicated that they could see this possibility. She also talked about this with her therapist (Dr. Tenny) who reportedly mentioned that it could be good for her to know, because it might change how they approach certain situations related to her anxiety and depression.   Pregnancy and Birth Information Medication during pregnancy: unknown                                   Exposure to substances or potentially harmful events in utero: unknown Complications during pregnancy/delivery: unknown Length of pregnancy: full term   Delivery method: schedule c-section   Birth weight: unknown Complications post-delivery: No   Developmental Milestones History of developmental/behavioral concerns/differences: Aailyah had anxiety before she realized that she might think differently than others, but by middle school she started to become aware that her brain might work differently than others (around the 6th grade). At this age, she started to realize that not everyone was thinking, feeling, or reacting the way she was. She had always connected these differences to her history of anxiety and depression but recently has wondered if it could  be something else.   Current/Past Speech/Language concerns: No  Age of first words: Within Normal Limits  Age of first 2-3-word phrases: WNL   Age of full sentences: WNL Age of walking without assistance: WNL  Any loss of previously attained skills:  No   Medical History: Medical or psychiatric concerns or diagnoses: anxiety, depression                                         Significant accidents, hospitalizations, surgeries, or infections: In her freshman year of college (April of 2022) Florean went to a mental hospital voluntarily for a little over a week                        Allergies: No                                                                                     Currently taking any medication: lamotrigine daily Generic birth control  Hydroxyzine  as needed for panic attacks   Current/past eating/feeding concerns: Lexus described herself as a picky eater. She eats at least some foods from all of the food groups, though noted that vegetables were the most limited for her.                                                   Current/past sleeping concerns: Cyann sometimes has trouble falling asleep, which she related to her anxiety because she has trouble turing her brain off at end of day.                              Hygiene concerns/changes: No                                           Trauma and Abuse History: Current/past exposure to traumas and/or significant stressors (e.g., abuse, witness to violence, fires, significant car accidents): No  Abuse History:  Victim of abuse: No    Report needed: No. Victim of Neglect:No. Witness / Exposure to Domestic Violence: No   Protective Services Involvement: No  Witness to [employer] Violence:  No   Psychiatric History Current/past aggressive behavior: No                        Current/past significant behavioral concerns (e.g., stealing, fire setting, annoying other on purpose or easily annoyed by others): No  Current/past hearing/seeing things not there or expressing unusual beliefs/ideas: No                         Current/past mood concerns (depressed or unusually elevated moods):  -- Typical mood: pleasant        -- depression:  Depression really became noticeable and  began interfering in her normal function during her freshman year of high school (2017) - currently - her depression has been well managed lately     -- elevated moods: No    --  rages/meltdowns: No                      Current/past anxiety concerns (separation, social, general): Carlei started experiencing anxiety in early elementary school but did not really realize that how she was feeling was not how everyone else felt until she was in middle school. In general, peers did not seem as stress out about things as she was during elementary school.  - current triggers of anxiety include doing anything where Delva is in a situation that she is not usually in or when she is with new people. For instance, tasks like getting an oil change makes me anxious  as does making phone calls or any time she has to engage in certain types of communication like sending important emails or making phone calls to doctors offices.  - she is also very afraid of the dentist   - current anxiety - Ronie reported that her anxiety is pretty under control right now.    Current/past obsessions (bothersome recurrent and persistent thoughts) or compulsions: check to make sure doors are locked a lot but not an excessive amount    Concerns regarding attention/focus/impulsivity: Devora tends to let her room get out of control because the idea of cleaning her room can be overwhelming. However, in most other aspects of life, Keegan is very organized.  - She had no difficulty listening or completing or turning in work    Current/past social concerns and/or restricted or repetitive behaviors: Ngina reported that she has had difficulty differentiating autism from social anxiety, and feels unsure about whether she has autism for this reason. Social situations make me anxious and she described herself a a little socially awkward but was not sure if that was right word for how she presents. Generally, she is not comfortable  in certain social situations.   -- Friendships: Shanaya has had trouble making friends. She has always had very small friend groups; for instance she has one friend from before college and only a handful of friends that she made in college.   -- Challenges with eye contact: I am not good at maintaining eye contact. Tiarna described her eye contact as good when someone is talking to her, but when she is talking she makes less eye contact, and when she does make eye contact it is because I am making a conscious effort to make eye contact.   -- Difficulty with changes or transitions: Morgaine does not like change or things changing, noting that things changing makes me very anxious.    -- Sensory: Joaquina reported feeling really overwhelmed in loud spaces or in places with many people. She has some issus with food textures (one of the reasons that she is a picky eater is because she is bothered by the texture of some foods more so than the taste). She does not have many texture issues with clothing.    - As noted above, other has brought up the idea that Ryleigh may be autistic. She reported that it is not one thing that has made people question this, but rather several things that she does. She also has multiple friends that are autistic and they said she definitely is as well. She has taken  some online tests for autism and they usually score in the very likely category. Donyel and her therapist have talked about some of the symptoms of autism, and the only thing that did not seem to fit for her was difficulty having conversations, since she feels that she is a talkative person and can have conversations. A lot of what she has read about ASD resonated with her, b she remains a bit unsure because of her difficulty separating behaviors related to ASD from symptoms of anxiety .   Current/past substance use/abuse: drink occasionally                             Current/past legal involvement or issues:  No   Risk Assessment: Current/past suicidal ideation: Current SI denied. Previously, Airiana had some SI thoughts around the time that she was hospitalized (April 2022). At that time she didn't have a specific plan or attempts but felt that if she did not get help right away she was going to do something because she wanted to kill herself at that time. She has not had anything like this occur since that time. nothing like that since  - She reported that she has reviewed safety planning with Dr. Tenny and knows what to do if the feelings come back.                                                                                    Current/past homicidal ideation: No   Danger to Self:  No Self-injurious Behavior: Current SIB denied. She has a history of self harming behavior in high school, but has not engaged in this since she was hospitalized in April of 2022. There have been occasions since then when she wanted to self harm, but did not. The most recent was in May of 2025. She let her friends know that she was feeling that way so they did not leave her alone and made sure that she was around people until she felt safe to be alone again. She also finds using cold water to shock her nervous system also provides immediate help in stopping those thoughts.   Danger to Others: No Duty to Warn:no Physical Aggression / Violence:No  Access to Firearms a concern: unknown Gang Involvement:No   Patient was educated about steps to take if suicide or homicide risk level increases between visits: yes - part of the consent in the beginning safety resources were reviewed and Chance reported that she has a safety plan with her current therapist.   While future psychiatric events cannot be accurately predicted, the patient does not currently require acute inpatient psychiatric care and does not currently meet [place]  involuntary commitment criteria.  Past Interventions Current/past  services/interventions: mental health therapy and psychiatrist    Outpatient Providers: Dr. 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 In 4th and 5th grade was in [place] was a advertising copywriter for academically advanced students [place]) which she went to for the 4-5th grade   Ever repeated a grade: No Records of prior testing: No     Current/past IEP or 504 Plan:  No -                                 Any formal or informal accommodations/support in school or out of school (e.g., private tutoring): Yes - Tamyah had accommodations in college for testing including testing in a more private location, and extra time on quizzes and tests. She could also get extensions on assignments if needed.                       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   Family and Social History                                                                   Language(s) spoken in the home/primary language:  English   With whom does the individual reside:  2 roommates     Family History:  Family History  Problem Relation Age of Onset   Migraines Brother    Alzheimer's disease Maternal Grandmother    Hyperlipidemia Maternal Grandmother    Arthritis Maternal Grandmother    Parkinson's disease Maternal Grandmother    Parkinson's disease Maternal Grandfather    Arthritis Maternal Grandfather    Endometrial cancer Paternal Grandmother    Thyroid  disease Paternal Grandmother    Arthritis Paternal Grandmother     Depression Paternal Grandmother    Anxiety disorder Cousin    Alcohol abuse Father    Allergies Father    Arthritis Paternal Grandfather    Early death Paternal Grandfather    Medical/psychiatric concerns in immediate family history:  brother - depression - possible anxiety  Medical/psychiatric concerns in extended maternal family history: cousin - anxiety and depression    Medical/psychiatric concerns in extended paternal family history: grandmother - anxiety and depression and borderline personality disorder                Consultations necessary/requested: Yes  - An attempt will be made to gather information from parents - getting information from Dr. Tenny is also okay    Relationship Status: single  Name of spouse / other: N/A If a parent, number of children / ages: no children   Pronouns: she/her   Support Systems: parents and friends   Financial Stress: No  Stressors in last 6 months: break up in June   Any cultural differences that may affect treatment:  not applicable   Recreation/Hobbies: Maevyn engages in regular arts and crafts, (painting, scrap booking), reading, and engages  in yoga and workout classes.  Strengths: Shabreka identified her strengths including that she is very emotionally intelligent, very empathetic, smart, very organized and she thinks she has good time management skills    Plan: Intake completed on 06/01/24. Concerns noted at the time included that Ardice has a history of anxiety and depression, but there have been questions about something else could be happening (such as ASD). Rochele has challenges with making friends and some social concerns. Ambika will return for an evaluation focused on potential autism spectrum disorder, anxiety, and/or mood challenges.    Testing is expected to answer the question, does the individual meet criteria for Autism Spectrum Disorder, and/or anxiety disorder when age, other concerns, and cognitive functioning  are taken into consideration? Further testing is warranted because a diagnosis cannot be given based on current interview data (further data is required). Psychological testing results are expected to answer the remaining diagnostic questions in order to provide an accurate diagnosis. Psychological testing results are expected to assist in treatment planning with an expectation of improved clinical outcome.   Current working diagnosis: Anxiety   Diagnoses to consider  R/O Autism Spectrum Disorder F84.0 R/O Generalized Anxiety Disorder F41.1 R/O Mood Disorder F39 R/O Social Anxiety Disorder F40.10   Proposed Test Battery:  Wechsler Adult Intelligence Scale-5 Autism Diagnostic Observation Schedule (ADOS-2) [place] Adaptive Behavior Scales  Social Responsiveness Scale-2 CAT-Q Behavioral Assessment System for Children - 3 Teacher and Parent (Self) DSM-5 Cross Cutting and Indicated Follow-ups PAI CNS Vital Signs Connor's  BRIEF Early Symptom Questionnaire   Keene Dumas, PhD

## 2024-06-14 ENCOUNTER — Other Ambulatory Visit: Admitting: Clinical

## 2024-06-22 ENCOUNTER — Ambulatory Visit: Admitting: Clinical

## 2024-07-07 ENCOUNTER — Other Ambulatory Visit: Admitting: Clinical

## 2024-07-09 ENCOUNTER — Ambulatory Visit: Admitting: Clinical

## 2024-07-29 ENCOUNTER — Ambulatory Visit: Admitting: Clinical

## 2024-08-06 ENCOUNTER — Ambulatory Visit: Admitting: Clinical
